# Patient Record
Sex: Female | Born: 1975 | Race: White | Hispanic: No | Marital: Married | Smoking: Current every day smoker
Health system: Southern US, Community
[De-identification: ages and names within clinical notes are randomized; demographics above are authoritative.]

## PROBLEM LIST (undated history)

## (undated) DIAGNOSIS — K219 Gastro-esophageal reflux disease without esophagitis: Secondary | ICD-10-CM

## (undated) DIAGNOSIS — F329 Major depressive disorder, single episode, unspecified: Secondary | ICD-10-CM

## (undated) DIAGNOSIS — K649 Unspecified hemorrhoids: Secondary | ICD-10-CM

## (undated) DIAGNOSIS — R0602 Shortness of breath: Secondary | ICD-10-CM

## (undated) DIAGNOSIS — J449 Chronic obstructive pulmonary disease, unspecified: Secondary | ICD-10-CM

## (undated) DIAGNOSIS — R079 Chest pain, unspecified: Secondary | ICD-10-CM

## (undated) DIAGNOSIS — F32A Depression, unspecified: Secondary | ICD-10-CM

## (undated) HISTORY — DX: Unspecified hemorrhoids: K64.9

## (undated) HISTORY — PX: NOVASURE ABLATION: SHX5394

## (undated) HISTORY — PX: TONSILLECTOMY: SUR1361

## (undated) HISTORY — PX: TUBAL LIGATION: SHX77

## (undated) HISTORY — PX: OTHER SURGICAL HISTORY: SHX169

---

## 2008-09-21 HISTORY — PX: OTHER SURGICAL HISTORY: SHX169

## 2012-04-11 ENCOUNTER — Encounter (HOSPITAL_COMMUNITY): Payer: Self-pay | Admitting: *Deleted

## 2012-04-11 ENCOUNTER — Emergency Department (HOSPITAL_COMMUNITY)
Admission: EM | Admit: 2012-04-11 | Discharge: 2012-04-11 | Disposition: A | Discharge status: Home / Self Care | Payer: Self-pay | Attending: Emergency Medicine | Admitting: Emergency Medicine

## 2012-04-11 ENCOUNTER — Emergency Department (HOSPITAL_COMMUNITY): Payer: Self-pay

## 2012-04-11 DIAGNOSIS — M79609 Pain in unspecified limb: Secondary | ICD-10-CM | POA: Insufficient documentation

## 2012-04-11 DIAGNOSIS — F17200 Nicotine dependence, unspecified, uncomplicated: Secondary | ICD-10-CM | POA: Insufficient documentation

## 2012-04-11 DIAGNOSIS — M79673 Pain in unspecified foot: Secondary | ICD-10-CM

## 2012-04-11 MED ORDER — IBUPROFEN 400 MG PO TABS: 400.0000 mg | ORAL_TABLET | Freq: Three times a day (TID) | ORAL | Status: AC

## 2012-04-11 NOTE — ED Notes (Signed)
Pt c/o foot pain--mid upper sole x 1 day; hurts to walk on it

## 2012-04-11 NOTE — ED Notes (Signed)
Pt stated her left foot is hurting, and hurts to walk on it. Pt dropped the dresser drawer on her left foot.

## 2012-04-11 NOTE — ED Provider Notes (Signed)
History     CSN: [id number]  Arrival date & time 04/11/12  1950   First MD Initiated Contact with Patient 04/11/12 2122      Chief Complaint  Patient presents with  . Foot Pain    (Consider location/radiation/quality/duration/timing/severity/associated sxs/prior treatment) HPI The patient presents with pain about her distal left foot.  She denies any history of trauma, falls, tripping.  She states the pain is focally about the foot with no radiation.  No attempts at relief thus far with medication.  Pain is worse with motion or weight-bearing.  No other complaints. History reviewed. No pertinent past medical history.  History reviewed. No pertinent past surgical history.  No family history on file.  History  Substance Use Topics  . Smoking status: Current Everyday Smoker -- 1.5 packs/day  . Smokeless tobacco: Not on file  . Alcohol Use: No    OB History    Grav Para Term Preterm Abortions TAB SAB Ect Mult Living                  Review of Systems  All other systems reviewed and are negative.    Allergies  Penicillins and Trazodone and nefazodone  Home Medications   Current Outpatient Rx  Name Route Sig Dispense Refill  . IBUPROFEN 400 MG PO TABS Oral Take 1 tablet (400 mg total) by mouth 3 (three) times daily. 12 tablet 0    BP 108/79  Temp 98.3 F (36.8 C) (Oral)  Resp 24  Ht 5\' 2"  (1.575 m)  Wt 106 lb (48.081 kg)  BMI 19.39 kg/m2  SpO2 94%  LMP 04/11/2012  Physical Exam  Nursing note and vitals reviewed. Constitutional: She appears well-developed and well-nourished. No distress.  HENT:  Head: Normocephalic and atraumatic.  Eyes: Conjunctivae are normal.  Cardiovascular: Intact distal pulses.   Pulmonary/Chest: No stridor.  Musculoskeletal:       Arms:      Feet:  Skin: She is not diaphoretic.    ED Course  Procedures (including critical care time)  Labs Reviewed - No data to display Dg Foot Complete Left  04/11/2012  *RADIOLOGY  REPORT*  Clinical Data: Left foot pain.  LEFT FOOT - COMPLETE 3+ VIEW  Comparison: None  Findings: The joint spaces are maintained.  No acute fracture.  Pes cavus is noted.  IMPRESSION: No acute fracture.  Original Report Authenticated By: P. Loralie Champagne, M.D.     1. Foot pain       MDM  This generally well female presents with atraumatic pain in her left foot.  On exam she is in no distress.  There are no deficiencies, no deformities.  Given her history of no trauma, there suspicion for an inflammatory process.  The patient was discharged with anti-inflammatory medication, ice packs, orthopedics followup.    Gerhard Munch, MD 04/11/12 2157

## 2012-04-11 NOTE — ED Notes (Signed)
Pt verbalizes understanding 

## 2012-10-30 ENCOUNTER — Emergency Department (HOSPITAL_COMMUNITY)
Admission: EM | Admit: 2012-10-30 | Discharge: 2012-10-30 | Disposition: A | Discharge status: Home / Self Care | Payer: Medicaid Other | Attending: Emergency Medicine | Admitting: Emergency Medicine

## 2012-10-30 ENCOUNTER — Emergency Department (HOSPITAL_COMMUNITY): Payer: Medicaid Other

## 2012-10-30 ENCOUNTER — Encounter (HOSPITAL_COMMUNITY): Payer: Self-pay | Admitting: Emergency Medicine

## 2012-10-30 DIAGNOSIS — K6289 Other specified diseases of anus and rectum: Secondary | ICD-10-CM | POA: Insufficient documentation

## 2012-10-30 DIAGNOSIS — Z3202 Encounter for pregnancy test, result negative: Secondary | ICD-10-CM | POA: Insufficient documentation

## 2012-10-30 DIAGNOSIS — K625 Hemorrhage of anus and rectum: Secondary | ICD-10-CM | POA: Insufficient documentation

## 2012-10-30 DIAGNOSIS — F17200 Nicotine dependence, unspecified, uncomplicated: Secondary | ICD-10-CM | POA: Insufficient documentation

## 2012-10-30 DIAGNOSIS — K649 Unspecified hemorrhoids: Secondary | ICD-10-CM | POA: Insufficient documentation

## 2012-10-30 LAB — PROTIME-INR
INR: 1 (ref 0.00–1.49)
Prothrombin Time: 13.1 s (ref 11.6–15.2)

## 2012-10-30 LAB — BASIC METABOLIC PANEL WITH GFR
BUN: 6 mg/dL (ref 6–23)
CO2: 25 meq/L (ref 19–32)
Calcium: 9.1 mg/dL (ref 8.4–10.5)
Chloride: 104 meq/L (ref 96–112)
Creatinine, Ser: 0.75 mg/dL (ref 0.50–1.10)
GFR calc Af Amer: >90 mL/min
GFR calc non Af Amer: >90 mL/min
Glucose, Bld: 97 mg/dL (ref 70–99)
Potassium: 3.9 meq/L (ref 3.5–5.1)
Sodium: 138 meq/L (ref 135–145)

## 2012-10-30 LAB — CBC WITH DIFFERENTIAL/PLATELET
Eosinophils Absolute: 0 10*3/uL (ref 0.0–0.7)
Eosinophils Relative: 1 % (ref 0–5)
HCT: 39.1 % (ref 36.0–46.0)
Lymphocytes Relative: 38 % (ref 12–46)
Lymphs Abs: 2.9 10*3/uL (ref 0.7–4.0)
MCH: 32.8 pg (ref 26.0–34.0)
MCV: 92.9 fL (ref 78.0–100.0)
Monocytes Absolute: 0.7 10*3/uL (ref 0.1–1.0)
RBC: 4.21 MIL/uL (ref 3.87–5.11)
RDW: 12.7 % (ref 11.5–15.5)
WBC: 7.7 10*3/uL (ref 4.0–10.5)

## 2012-10-30 MED ORDER — IOHEXOL 300 MG/ML  SOLN
50.0000 mL | Freq: Once | INTRAMUSCULAR | Status: AC | PRN
  Administered 2012-10-30: 50 mL via ORAL

## 2012-10-30 MED ORDER — OXYCODONE-ACETAMINOPHEN 5-325 MG PO TABS
2.0000 | ORAL_TABLET | Freq: Once | ORAL | Status: AC
  Administered 2012-10-30: 2 via ORAL
  Filled 2012-10-30: qty 2

## 2012-10-30 MED ORDER — DOCUSATE SODIUM 100 MG PO CAPS: 100.0000 mg | ORAL_CAPSULE | Freq: Two times a day (BID) | ORAL | Status: DC

## 2012-10-30 MED ORDER — IOHEXOL 300 MG/ML  SOLN
80.0000 mL | Freq: Once | INTRAMUSCULAR | Status: AC | PRN
  Administered 2012-10-30: 80 mL via INTRAVENOUS

## 2012-10-30 MED ORDER — HYDROCORTISONE 2.5 % RE CREA: TOPICAL_CREAM | RECTAL | Status: DC

## 2012-10-30 MED ORDER — OXYCODONE-ACETAMINOPHEN 5-325 MG PO TABS: 2.0000 | ORAL_TABLET | ORAL | Status: DC | PRN

## 2012-10-30 NOTE — ED Notes (Addendum)
Pt c/o pain d/t hemorrhoids. Pt reports she has hx of them and gets them often. Pt reports these started on Friday and now the pain is so bad she can barely stand. Pt reports she has had to have them cut off multiple times but thinks they keep coming. Pt reports she saw a lot of bright blood after using the bathroom a couple days ago but hasnt notice any today. Pt in nad. Skin warm and dry, resp e/u.

## 2012-10-30 NOTE — ED Notes (Signed)
Pt sts painful hemorrhoids x 3 weeks with some rectal bleeding

## 2012-10-30 NOTE — ED Notes (Signed)
Pt discharged to home with family. NAD.  

## 2012-10-30 NOTE — ED Notes (Signed)
Pt finished contrast, CT called.

## 2012-10-30 NOTE — ED Provider Notes (Signed)
History     CSN: [id number]  Arrival date & time 10/30/12  1219   First MD Initiated Contact with Patient 10/30/12 1339      Chief Complaint  Patient presents with  . Hemorrhoids  . Rectal Bleeding    (Consider location/radiation/quality/duration/timing/severity/associated sxs/prior treatment) HPI Comments: Patient presents with rectal pain for the past day.  She states she has a history of internal hemorroids that occasionally cause her pain. She is painful defecation as well as red blood on toilet bowl filled the toilet water red. She denies abdominal pain nausea, vomiting or fever. She's had previous hemorrhoidectomies done not by a Careers adviser. She denies any chest pain, abdominal pain or back pain.  The history is provided by the patient.    History reviewed. No pertinent past medical history.  History reviewed. No pertinent past surgical history.  History reviewed. No pertinent family history.  History  Substance Use Topics  . Smoking status: Current Every Day Smoker -- 1.50 packs/day  . Smokeless tobacco: Not on file  . Alcohol Use: No    OB History   Grav Para Term Preterm Abortions TAB SAB Ect Mult Living                  Review of Systems  Constitutional: Negative for fever, activity change and appetite change.  HENT: Negative for congestion and rhinorrhea.   Respiratory: Negative for cough, chest tightness and shortness of breath.   Cardiovascular: Negative for chest pain.  Gastrointestinal: Positive for blood in stool, hematochezia and anal bleeding. Negative for nausea, vomiting and abdominal pain.  Genitourinary: Negative for dysuria, vaginal bleeding and vaginal discharge.  Musculoskeletal: Negative for back pain.  A complete 10 system review of systems was obtained and all systems are negative except as noted in the HPI and PMH.    Allergies  Penicillins and Trazodone and nefazodone  Home Medications   Current Outpatient Rx  Name  Route  Sig   Dispense  Refill  . acetaminophen (TYLENOL) 500 MG tablet   Oral   Take 500 mg by mouth every 6 (six) hours as needed for pain. For pain         . ibuprofen (ADVIL,MOTRIN) 200 MG tablet   Oral   Take 400 mg by mouth every 6 (six) hours as needed for pain. For pain         . docusate sodium (COLACE) 100 MG capsule   Oral   Take 1 capsule (100 mg total) by mouth every 12 (twelve) hours.   60 capsule   0   . hydrocortisone (ANUSOL-HC) 2.5 % rectal cream      Apply rectally 2 times daily   30 g   0   . oxyCODONE-acetaminophen (PERCOCET/ROXICET) 5-325 MG per tablet   Oral   Take 2 tablets by mouth every 4 (four) hours as needed for pain.   15 tablet   0     BP 97/52  Pulse 63  Temp(Src) 98.2 F (36.8 C) (Oral)  Resp 16  SpO2 99%  LMP 10/16/2012  Physical Exam  Constitutional: She is oriented to person, place, and time. She appears well-developed and well-nourished. No distress.  HENT:  Head: Normocephalic and atraumatic.  Mouth/Throat: Oropharynx is clear and moist. No oropharyngeal exudate.  Eyes: EOM are normal. Pupils are equal, round, and reactive to light.  Neck: Normal range of motion. Neck supple.  Cardiovascular: Normal rate and normal heart sounds.   No murmur heard. Pulmonary/Chest: Effort normal  and breath sounds normal. No respiratory distress.  Abdominal: There is no tenderness. There is no rebound and no guarding.  Genitourinary: Guaiac positive stool.  Multiple small external hemorroids, no thrombosis.  Painful digital exam.  Musculoskeletal: Normal range of motion. She exhibits no edema and no tenderness.  Neurological: She is alert and oriented to person, place, and time. No cranial nerve deficit. She exhibits normal muscle tone. Coordination normal.  Skin: Skin is warm.    ED Course  Procedures (including critical care time)  Labs Reviewed  OCCULT BLOOD, POC DEVICE - Abnormal; Notable for the following:    Fecal Occult Bld POSITIVE (*)     All other components within normal limits  CBC WITH DIFFERENTIAL  PROTIME-INR  BASIC METABOLIC PANEL  POCT PREGNANCY, URINE   Ct Pelvis W Contrast  10/30/2012  *RADIOLOGY REPORT*  Clinical Data:  Rectal bleeding.  Hemorrhoidal pain.  Query regional infection.  CT PELVIS WITH CONTRAST  Technique:  Multidetector CT imaging of the pelvis was performed using the standard protocol following the bolus administration of intravenous contrast.  Contrast: 80mL OMNIPAQUE IOHEXOL 300 MG/ML  SOLN  Comparison:  None  Findings:  No abnormal rim enhancing structure in the perirectal region to favor perirectal abscess.  Equivocal stranding in the upper perirectal space may be secondary to mild localized inflammation.  No abnormal stranding in the issue rectal fossa on either side.  Posterior wall thickening in the uterus noted with low density potentially from fibroid or adenomyosis.  Urinary bladder unremarkable.  Ovaries unremarkable. No pathologic pelvic adenopathy is identified.  Orally administered contrast extends through to the cecum, and the appendix appears normal.  IMPRESSION:  1.  No specific findings of perirectal abscess, although there is some mild stranding in the upper perirectal space which could reflect low-level inflammation. 2.  Posterior wall hypodensity and thickening in the uterus, potentially from adenomyosis or an unusual fibroid.   Original Report Authenticated By: Gaylyn Rong, M.D.      1. Hemorrhoid       MDM  Rectal pain with history of hemorrhoids. Vital stable, no distress, abdomen soft and nontender.  Hemoglobin stable.  No thrombosed hemorrhoid on exam. Given degree of discomfort, bloody stools and subjective fevers, CT obtained to r/o possible perirectal abscess.  Anticipate discharge home with anusol, stool softners, sitz baths and gen surgery followup.  CT pending at tie of sign out to Dr. Anitra Lauth.      Glynn Octave, MD 10/30/12 [phone]

## 2012-11-02 ENCOUNTER — Encounter (INDEPENDENT_AMBULATORY_CARE_PROVIDER_SITE_OTHER): Payer: Self-pay | Admitting: Surgery

## 2012-11-02 ENCOUNTER — Ambulatory Visit (INDEPENDENT_AMBULATORY_CARE_PROVIDER_SITE_OTHER): Payer: Medicaid Other | Admitting: Surgery

## 2012-11-02 VITALS — BP 122/76 | HR 84 | Temp 98.4°F | Resp 14 | Ht 62.0 in | Wt 109.0 lb

## 2012-11-02 DIAGNOSIS — K5900 Constipation, unspecified: Secondary | ICD-10-CM

## 2012-11-02 DIAGNOSIS — Z720 Tobacco use: Secondary | ICD-10-CM | POA: Insufficient documentation

## 2012-11-02 DIAGNOSIS — K648 Other hemorrhoids: Secondary | ICD-10-CM | POA: Insufficient documentation

## 2012-11-02 DIAGNOSIS — K5909 Other constipation: Secondary | ICD-10-CM

## 2012-11-02 DIAGNOSIS — F17200 Nicotine dependence, unspecified, uncomplicated: Secondary | ICD-10-CM

## 2012-11-02 MED ORDER — IBUPROFEN 200 MG PO TABS: 400.0000 mg | ORAL_TABLET | Freq: Three times a day (TID) | ORAL | Status: DC

## 2012-11-02 MED ORDER — HYDROCORTISONE ACE-PRAMOXINE 2.5-1 % RE CREA: TOPICAL_CREAM | Freq: Four times a day (QID) | RECTAL | Status: DC

## 2012-11-02 NOTE — Patient Instructions (Addendum)
See the Handout(s) we gave you.  Consider surgery Using the [facility] to ligate and pexy the hemorrhoids back up to the rectum.  Please call our office at [phone] if you wish to schedule surgery or if you have further questions / concerns.   HEMORRHOIDS  The rectum is the last foot of your colon, and it naturally stretches to hold stool.  Hemorrhoidal piles are natural clusters of blood vessels that help the rectum and anal canal stretch to hold stool and allow bowel movements to eliminate feces.   Hemorrhoids are abnormally swollen blood vessels in the rectum.  Too much pressure in the rectum causes hemorrhoids by forcing blood to stretch and bulge the walls of the veins, sometimes even rupturing them.  Hemorrhoids can become like varicose veins you might see on a person's legs.  Most people will develop a flare of hemorrhoids in their lifetime.  When bulging hemorrhoidal veins are irritated, they can swell, burn, itch, cause pain, and bleed.  Most flares will calm down gradually own within a few weeks.  However, once hemorrhoids are created, they are difficult to get rid of completely and tend to flare more easily than the first flare.   Fortunately, good habits and simple medical treatment usually control hemorrhoids well, and surgery is needed only in severe cases. Types of Hemorrhoids:  Internal hemorrhoids usually don't initially hurt or itch; they are deep inside the rectum and usually have no sensation. If they begin to push out (prolapse), pain and burning can occur.  However, internal hemorrhoids can bleed.  Anal bleeding should not be ignored since bleeding could come from a dangerous source like colorectal cancer, so persistent rectal bleeding should be investigated by a doctor, sometimes with a colonoscopy.  External hemorrhoids cause most of the symptoms - pain, burning, and itching. Nonirritated hemorrhoids can look like small skin tags coming out of the anus.   Thrombosed hemorrhoids can  form when a hemorrhoid blood vessel bursts and causes the hemorrhoid to suddenly swell.  A purple blood clot can form in it and become an excruciatingly painful lump at the anus. Because of these unpleasant symptoms, immediate incision and drainage by a surgeon at an office visit can provide much relief of the pain.    PREVENTION Avoiding the most frequent causes listed below will prevent most cases of hemorrhoids: Constipation Hard stools Diarrhea  Constant sitting  Straining with bowel movements Sitting on the toilet for a long time  Severe coughing  episodes Pregnancy / Childbirth  Heavy Lifting  Sometimes avoiding the above triggers is difficult:  How can you avoid sitting all day if you have a seated job? Also, we try to avoid coughing and diarrhea, but sometimes it's beyond your control.  Still, there are some practical hints to help: Keep the anal and genital area clean.  Moistened tissues such as flushable wet wipes are less irritating than toilet paper.  Using irrigating showers or bottle irrigation washing gently cleans this sensitive area.   Avoid dry toilet paper when cleaning after bowel movements.  Marland Kitchen Keep the anal and genital area dry.  Lightly pat the rectal area dry.  Avoid rubbing.  Talcum or baby powders can help GET YOUR STOOLS SOFT.   This is the most important way to prevent irritated hemorrhoids.  Hard stools are like sandpaper to the anorectal canal and will cause more problems.  The goal: ONE SOFT BOWEL MOVEMENT A DAY!  BMs from every other day to 3 times  a day is a tolerable range Treat coughing, diarrhea and constipation early since irritated hemorrhoids may soon follow.  If your main job activity is seated, always stand or walk during your breaks. Make it a point to stand and walk at least 5 minutes every hour and try to shift frequently in your chair to avoid direct rectal pressure.  Always exhale as you strain or lift. Don't hold your breath.  Do not delay or try to  prevent a bowel movement when the urge is present. Exercise regularly (walking or jogging 60 minutes a day) to stimulate the bowels to move. No reading or other activity while on the toilet. If bowel movements take longer than 5 minutes, you are too constipated. AVOID CONSTIPATION Drink plenty of liquids (1 1/2 to 2 quarts of water and other fluids a day unless fluid restricted for another medical condition). Liquids that contain caffeine (coffee a, tea, soft drinks) can be dehydrating and should be avoided until constipation is controlled. Consider minimizing milk, as dairy products may be constipating. Eat plenty of fiber (30g a day ideal, more if needed).  Fiber is the undigested part of plant food that passes into the colon, acting as "natures broom" to encourage bowel motility and movement.  Fiber can absorb and hold large amounts of water. This results in a larger, bulkier stool, which is soft and easier to pass.  Eating foods high in fiber - 12 servings - such as  Vegetables: Root (potatoes, carrots, turnips), Leafy green (lettuce, salad greens, celery, spinach), High residue (cabbage, broccoli, etc.) Fruit: Fresh, Dried (prunes, apricots, cherries), Stewed (applesauce)  Whole grain breads, pasta, whole wheat Bran cereals, muffins, etc. Consider adding supplemental bulking fiber which retains large volumes of water: Psyllium ground seeds --available as Metamucil, Konsyl, Effersyllium, Per Diem Fiber, or the less expensive generic forms.  Citrucel  (methylcellulose wood fiber) . FiberCon (Polycarbophil) Polyethylene Glycol - and "artificial" fiber commonly called Miralax or Glycolax.  It is helpful for people with gassy or bloated feelings with regular fiber Flax Seed - a less gassy natural fiber  Laxatives can be useful for a short period if constipation is severe Osmotics (Milk of Magnesia, Fleets Phospho-Soda, Magnesium Citrate)  Stimulants (Senokot,   Castor Oil,  Dulcolax, Ex-Lax)     Laxatives are not a good long-term solution as it can stress the bowels and cause too much mineral loss and dehydration.   Avoid taking laxatives for more than 7 days in a row.  AVOID DIARRHEA Switch to liquids and simpler foods for a few days to avoid stressing your intestines further. Avoid dairy products (especially milk & ice cream) for a short time.  The intestines often can lose the ability to digest lactose when stressed. Avoid foods that cause gassiness or bloating.  Typical foods include beans and other legumes, cabbage, broccoli, and dairy foods.  Every person has some sensitivity to other foods, so listen to your body and avoid those foods that trigger problems for you. Adding fiber (Citrucel, Metamucil, FiberCon, Flax seed, Miralax) gradually can help thicken stools by absorbing excess fluid and retrain the intestines to act more normally.  Slowly increase the dose over a few weeks.  Too much fiber too soon can backfire and cause cramping & bloating. Probiotics (such as active yogurt, Align, etc) may help repopulate the intestines and colon with normal bacteria and calm down a sensitive digestive tract.  Most studies show it to be of mild help, though, and such products can be  costly. Medicines: Bismuth subsalicylate (ex. Kayopectate, Pepto Bismol) every 30 minutes for up to 6 doses can help control diarrhea.  Avoid if pregnant. Loperamide (Immodium) can slow down diarrhea.  Start with two tablets (4mg  total) first and then try one tablet every 6 hours.  Avoid if you are having fevers or severe pain.  If you are not better or start feeling worse, stop all medicines and call your doctor for advice Call your doctor if you are getting worse or not better.  Sometimes further testing (cultures, endoscopy, X-ray studies, bloodwork, etc) may be needed to help diagnose and treat the cause of the diarrhea. TREATMENT OF HEMORRHOID FLARE If these preventive measures fail, you must take action right  away! Hemorrhoids are one condition that can be mild in the morning and become intolerable by nightfall. Most hemorrhoidal flares take several weeks to calm down.  These suggestions can help: Warm soaks.  This helps more than any topical medication.  Use up to 8 times a day.  Usually sitz baths or sitting in a warm bathtub helps.  Sitting on moist warm towels are helpful.  Switching to ice packs/cool compresses can be helpful Normalize your bowels.  Extremes of diarrhea or constipation will make hemorrhoids worse.  One soft bowel movement a day is the goal.  Fiber can help get your bowels regular Wet wipes instead of toilet paper Pain control with a NSAID such as ibuprofen (Advil) or naproxen (Aleve) or acetaminophen (Tylenol) around the clock.  Narcotics are constipating and should be minimized if possible Topical creams contain steroids (bydrocortisone) or local anesthetic (xylocaine) can help make pain and itching more tolerable.   EVALUATION If hemorrhoids are still causing problems, you could benefit by an evaluation by a surgeon.  The surgeon will obtain a history and examine you.  If hemorrhoids are diagnosed, some therapies can be offered in the office, usually with an anoscope into the less sensitive area of the rectum: -injection of hemorrhoids (sclerotherapy) can scar the blood vessels of the swollen/enlarged hemorrhoids to help shrink them down to a more normal size -rubber banding of the enlarged hemorrhoids to help shrink them down to a more normal size -drainage of the blood clot causing a thrombosed hemorrhoid,  to relieve the severe pain   While 90% of the time such problems from hemorrhoids can be managed without preceding to surgery, sometimes the hemorrhoids require a operation to control the problem (uncontrolled bleeding, prolapse, pain, etc.).   This involves being placed under general anesthesia where the surgeon can confirm the diagnosis and remove, suture, or staple the  hemorrhoid(s).  Your surgeon can help you treat the problem appropriately.    High-Fiber Diet Fiber is found in fruits, vegetables, and grains. A high-fiber diet encourages the addition of more whole grains, legumes, fruits, and vegetables in your diet. The recommended amount of fiber for adult males is 38 g per day. For adult females, it is 25 g per day. Pregnant and lactating women should get 28 g of fiber per day. If you have a digestive or bowel problem, ask your caregiver for advice before adding high-fiber foods to your diet. Eat a variety of high-fiber foods instead of only a select few type of foods.  PURPOSE  To increase stool bulk.  To make bowel movements more regular to prevent constipation.  To lower cholesterol.  To prevent overeating. WHEN IS THIS DIET USED?  It may be used if you have constipation and hemorrhoids.  It may  be used if you have uncomplicated diverticulosis (intestine condition) and irritable bowel syndrome.  It may be used if you need help with weight management.  It may be used if you want to add it to your diet as a protective measure against atherosclerosis, diabetes, and cancer. SOURCES OF FIBER  Whole-grain breads and cereals.  Fruits, such as apples, oranges, bananas, berries, prunes, and pears.  Vegetables, such as green peas, carrots, sweet potatoes, beets, broccoli, cabbage, spinach, and artichokes.  Legumes, such split peas, soy, lentils.  Almonds. FIBER CONTENT IN FOODS Starches and Grains / Dietary Fiber (g)  Cheerios, 1 cup / 3 g  Corn Flakes cereal, 1 cup / 0.7 g  Rice crispy treat cereal, 1 cup / 0.3 g  Instant oatmeal (cooked),  cup / 2 g  Frosted wheat cereal, 1 cup / 5.1 g  Brown, long-grain rice (cooked), 1 cup / 3.5 g  White, long-grain rice (cooked), 1 cup / 0.6 g  Enriched macaroni (cooked), 1 cup / 2.5 g Legumes / Dietary Fiber (g)  Baked beans (canned, plain, or vegetarian),  cup / 5.2 g  Kidney beans  (canned),  cup / 6.8 g  Pinto beans (cooked),  cup / 5.5 g Breads and Crackers / Dietary Fiber (g)  Plain or honey graham crackers, 2 squares / 0.7 g  Saltine crackers, 3 squares / 0.3 g  Plain, salted pretzels, 10 pieces / 1.8 g  Whole-wheat bread, 1 slice / 1.9 g  White bread, 1 slice / 0.7 g  Raisin bread, 1 slice / 1.2 g  Plain bagel, 3 oz / 2 g  Flour tortilla, 1 oz / 0.9 g  Corn tortilla, 1 small / 1.5 g  Hamburger or hotdog bun, 1 small / 0.9 g Fruits / Dietary Fiber (g)  Apple with skin, 1 medium / 4.4 g  Sweetened applesauce,  cup / 1.5 g  Banana,  medium / 1.5 g  Grapes, 10 grapes / 0.4 g  Orange, 1 small / 2.3 g  Raisin, 1.5 oz / 1.6 g  Melon, 1 cup / 1.4 g Vegetables / Dietary Fiber (g)  Green beans (canned),  cup / 1.3 g  Carrots (cooked),  cup / 2.3 g  Broccoli (cooked),  cup / 2.8 g  Peas (cooked),  cup / 4.4 g  Mashed potatoes,  cup / 1.6 g  Lettuce, 1 cup / 0.5 g  Corn (canned),  cup / 1.6 g  Tomato,  cup / 1.1 g Document Released: 09/07/2005 Document Revised: 03/08/2012 Document Reviewed: 12/10/2011 [facility] Patient Information 2013 [place], [place].  Smoking Cessation, Tips for Success YOU CAN QUIT SMOKING If you are ready to quit smoking, congratulations! You have chosen to help yourself be healthier. Cigarettes bring nicotine, tar, carbon monoxide, and other irritants into your body. Your lungs, heart, and blood vessels will be able to work better without these poisons. There are many different ways to quit smoking. Nicotine gum, nicotine patches, a nicotine inhaler, or nicotine nasal spray can help with physical craving. Hypnosis, support groups, and medicines help break the habit of smoking. Here are some tips to help you quit for good.  Throw away all cigarettes.  Clean and remove all ashtrays from your home, work, and car.  On a card, write down your reasons for quitting. Carry the card with you and read it when you  get the urge to smoke.  Cleanse your body of nicotine. Drink enough water and fluids to keep your urine  clear or pale yellow. Do this after quitting to flush the nicotine from your body.  Learn to predict your moods. Do not let a bad situation be your excuse to have a cigarette. Some situations in your life might tempt you into wanting a cigarette.  Never have "just one" cigarette. It leads to wanting another and another. Remind yourself of your decision to quit.  Change habits associated with smoking. If you smoked while driving or when feeling stressed, try other activities to replace smoking. Stand up when drinking your coffee. Brush your teeth after eating. Sit in a different chair when you read the paper. Avoid alcohol while trying to quit, and try to drink fewer caffeinated beverages. Alcohol and caffeine may urge you to smoke.  Avoid foods and drinks that can trigger a desire to smoke, such as sugary or spicy foods and alcohol.  Ask people who smoke not to smoke around you.  Have something planned to do right after eating or having a cup of coffee. Take a walk or exercise to perk you up. This will help to keep you from overeating.  Try a relaxation exercise to calm you down and decrease your stress. Remember, you may be tense and nervous for the first 2 weeks after you quit, but this will pass.  Find new activities to keep your hands busy. Play with a pen, coin, or rubber band. Doodle or draw things on paper.  Brush your teeth right after eating. This will help cut down on the craving for the taste of tobacco after meals. You can try mouthwash, too.  Use oral substitutes, such as lemon drops, carrots, a cinnamon stick, or chewing gum, in place of cigarettes. Keep them handy so they are available when you have the urge to smoke.  When you have the urge to smoke, try deep breathing.  Designate your home as a nonsmoking area.  If you are a heavy smoker, ask your caregiver about a  prescription for nicotine chewing gum. It can ease your withdrawal from nicotine.  Reward yourself. Set aside the cigarette money you save and buy yourself something nice.  Look for support from others. Join a support group or smoking cessation program. Ask someone at home or at work to help you with your plan to quit smoking.  Always ask yourself, "Do I need this cigarette or is this just a reflex?" Tell yourself, "Today, I choose not to smoke," or "I do not want to smoke." You are reminding yourself of your decision to quit, even if you do smoke a cigarette. HOW WILL I FEEL WHEN I QUIT SMOKING?  The benefits of not smoking start within days of quitting.  You may have symptoms of withdrawal because your body is used to nicotine (the addictive substance in cigarettes). You may crave cigarettes, be irritable, feel very hungry, cough often, get headaches, or have difficulty concentrating.  The withdrawal symptoms are only temporary. They are strongest when you first quit but will go away within 10 to 14 days.  When withdrawal symptoms occur, stay in control. Think about your reasons for quitting. Remind yourself that these are signs that your body is healing and getting used to being without cigarettes.  Remember that withdrawal symptoms are easier to treat than the major diseases that smoking can cause.  Even after the withdrawal is over, expect periodic urges to smoke. However, these cravings are generally short-lived and will go away whether you smoke or not. Do not smoke!  If  you relapse and smoke again, do not lose hope. Most smokers quit 3 times before they are successful.  If you relapse, do not give up! Plan ahead and think about what you will do the next time you get the urge to smoke. LIFE AS A NONSMOKER: MAKE IT FOR A MONTH, MAKE IT FOR LIFE Day 1: Hang this page where you will see it every day. Day 2: Get rid of all ashtrays, matches, and lighters. Day 3: Drink water. Breathe  deeply between sips. Day 4: Avoid places with smoke-filled air, such as bars, clubs, or the smoking section of restaurants. Day 5: Keep track of how much money you save by not smoking. Day 6: Avoid boredom. Keep a good book with you or go to the movies. Day 7: Reward yourself! One week without smoking! Day 8: Make a dental appointment to get your teeth cleaned. Day 9: Decide how you will turn down a cigarette before it is offered to you. Day 10: Review your reasons for quitting. Day 11: Distract yourself. Stay active to keep your mind off smoking and to relieve tension. Take a walk, exercise, read a book, do a crossword puzzle, or try a new hobby. Day 12: Exercise. Get off the bus before your stop or use stairs instead of escalators. Day 13: Call on friends for support and encouragement. Day 14: Reward yourself! Two weeks without smoking! Day 15: Practice deep breathing exercises. Day 16: Bet a friend that you can stay a nonsmoker. Day 17: Ask to sit in nonsmoking sections of restaurants. Day 18: Hang up "No Smoking" signs. Day 19: Think of yourself as a nonsmoker. Day 20: Each morning, tell yourself you will not smoke. Day 21: Reward yourself! Three weeks without smoking! Day 22: Think of smoking in negative ways. Remember how it stains your teeth, gives you bad breath, and leaves you short of breath. Day 23: Eat a nutritious breakfast. Day 24:Do not relive your days as a smoker. Day 25: Hold a pencil in your hand when talking on the telephone. Day 26: Tell all your friends you do not smoke. Day 27: Think about how much better food tastes. Day 28: Remember, one cigarette is one too many. Day 29: Take up a hobby that will keep your hands busy. Day 30: Congratulations! One month without smoking! Give yourself a big reward. Your caregiver can direct you to community resources or hospitals for support, which may include:  Group support.  Education.  Hypnosis.  Subliminal  therapy. Document Released: 06/05/2004 Document Revised: 11/30/2011 Document Reviewed: 06/24/2009 [facility] Patient Information 2013 [place], [place].

## 2012-11-02 NOTE — Progress Notes (Addendum)
Subjective:     Patient ID: Vickie Doyle, female   DOB: 04/24/1976, 36 y.o.   MRN: [id number]  HPI  [name]  10/21/1975 [id number]  Patient has no care team.  This patient is a 36 y.o.female who presents today for surgical evaluation at the request of Dr. Rancour, [place] [facility].   Reason for visit: Anal pain.  ? Worsening recurrent hemorrhoid disease.  Pleasant smoking female.  Has struggled with hemorrhoids for several years.  Usually manage down and [place] region with the [facility].  Has had what sounds like thrombosed hemorrhoids lanced at least two times in the past few years.  Had a colonoscopy which showed one polyp.  Due to from for followup colonoscopy next year.    Struggles with constipation.  Used to be rather severe.  Uses a probiotic Align to have a bowel movement every 2-3 days.  Claims she is "like a bird, so I can't make much to have a BM".  Cannot walk about 15 minutes before she has to stop.  Former heavy smoker.  Down to less than a pack now.  Has a lot of discomfort with defecation.  Sometimes sharp.  Often has bleeding.  Feels frustrated.  Pain usually worse around the time of menses.  Feels like surgery needs to be done.  No personal nor family history of GI/colon cancer, inflammatory bowel disease, irritable bowel syndrome, allergy such as Celiac Sprue, dietary/dairy problems, colitis, ulcers nor gastritis.  No recent sick contacts/gastroenteritis.  No travel outside the country.  No changes in diet.    There is no problem list on file for this patient.   Past Medical History  Diagnosis Date  . Hemorrhoids     History reviewed. No pertinent past surgical history.  History   Social History  . Marital Status: Married    Spouse Name: N/A    Number of Children: N/A  . Years of Education: N/A   Occupational History  . Not on file.   Social History Main Topics  . Smoking status: Current Every Day Smoker -- 1.00 packs/day  .  Smokeless tobacco: Not on file  . Alcohol Use: No  . Drug Use: Yes    Special: Marijuana  . Sexually Active: Not on file   Other Topics Concern  . Not on file   Social History Narrative  . No narrative on file    Family History  Problem Relation Age of Onset  . Heart disease Sister     Current Outpatient Prescriptions  Medication Sig Dispense Refill  . acetaminophen (TYLENOL) 500 MG tablet Take 500 mg by mouth every 6 (six) hours as needed for pain. For pain      . docusate sodium (COLACE) 100 MG capsule Take 1 capsule (100 mg total) by mouth every 12 (twelve) hours.  60 capsule  0  . hydrocortisone (ANUSOL-HC) 2.5 % rectal cream Apply rectally 2 times daily  30 g  0  . ibuprofen (ADVIL,MOTRIN) 200 MG tablet Take 400 mg by mouth every 6 (six) hours as needed for pain. For pain      . oxyCODONE-acetaminophen (PERCOCET/ROXICET) 5-325 MG per tablet Take 2 tablets by mouth every 4 (four) hours as needed for pain.  15 tablet  0   No current facility-administered medications for this visit.     Allergies  Allergen Reactions  . Penicillins Hives  . Trazodone And Nefazodone Nausea And Vomiting    BP 122/76  Pulse 84  Temp(Src) 98.4   F (36.9 C) (Temporal)  Resp 14  Ht 5' 2" (1.575 m)  Wt 109 lb (49.442 kg)  BMI 19.93 kg/m2  LMP 10/16/2012  Ct Pelvis W Contrast  10/30/2012  *RADIOLOGY REPORT*  Clinical Data:  Rectal bleeding.  Hemorrhoidal pain.  Query regional infection.  CT PELVIS WITH CONTRAST  Technique:  Multidetector CT imaging of the pelvis was performed using the standard protocol following the bolus administration of intravenous contrast.  Contrast: 80mL OMNIPAQUE IOHEXOL 300 MG/ML  SOLN  Comparison:  None  Findings:  No abnormal rim enhancing structure in the perirectal region to favor perirectal abscess.  Equivocal stranding in the upper perirectal space may be secondary to mild localized inflammation.  No abnormal stranding in the issue rectal fossa on either side.   Posterior wall thickening in the uterus noted with low density potentially from fibroid or adenomyosis.  Urinary bladder unremarkable.  Ovaries unremarkable. No pathologic pelvic adenopathy is identified.  Orally administered contrast extends through to the cecum, and the appendix appears normal.  IMPRESSION:  1.  No specific findings of perirectal abscess, although there is some mild stranding in the upper perirectal space which could reflect low-level inflammation. 2.  Posterior wall hypodensity and thickening in the uterus, potentially from adenomyosis or an unusual fibroid.   Original Report Authenticated By: Walter Liebkemann, M.D.      Review of Systems  Constitutional: Negative for fever, chills, diaphoresis, appetite change and fatigue.  HENT: Negative for ear pain, sore throat, trouble swallowing, neck pain and ear discharge.   Eyes: Negative for photophobia, discharge and visual disturbance.  Respiratory: Negative for cough, choking, chest tightness and shortness of breath.   Cardiovascular: Negative for chest pain and palpitations.  Gastrointestinal: Negative for nausea, vomiting, abdominal pain, diarrhea, constipation, anal bleeding and rectal pain.  Genitourinary: Negative for dysuria, frequency and difficulty urinating.  Musculoskeletal: Negative for myalgias and gait problem.  Skin: Negative for color change, pallor and rash.  Neurological: Negative for dizziness, speech difficulty, weakness and numbness.  Hematological: Negative for adenopathy.  Psychiatric/Behavioral: Negative for confusion and agitation. The patient is not nervous/anxious.        Objective:   Physical Exam  Constitutional: She is oriented to person, place, and time. She appears well-developed and well-nourished. No distress.  HENT:  Head: Normocephalic. Head is without raccoon's eyes, without Battle's sign and without abrasion.  Mouth/Throat: Oropharynx is clear and moist. No oropharyngeal exudate.  Face  with premature aging consistent with chronic smoker  Eyes: Conjunctivae and EOM are normal. Pupils are equal, round, and reactive to light. No scleral icterus.  Neck: Normal range of motion. Neck supple. No tracheal deviation present.  Cardiovascular: Normal rate, regular rhythm and intact distal pulses.   Pulmonary/Chest: Effort normal and breath sounds normal. No respiratory distress. She exhibits no tenderness.  Abdominal: Soft. She exhibits no distension and no mass. There is no tenderness. Hernia confirmed negative in the right inguinal area and confirmed negative in the left inguinal area.  Genitourinary: No vaginal discharge found.  Exam done with assistance of female Medical Assistant in the room.  Perianal skin clean with good hygiene.  No pruritis.  No pilonidal disease.  No fissure.  No abscess/fistula.    Tolerates digital rectal exam fairly.  Anoscopy not attempted with sensitivity.  Normal sphincter tone.  No rectal masses.  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  Chronically prolapsed partially.  Easily prolapses with mild Valsalva.  No thrombosis.   Musculoskeletal: Normal range of   motion. She exhibits no tenderness.  Lymphadenopathy:    She has no cervical adenopathy.       Right: No inguinal adenopathy present.       Left: No inguinal adenopathy present.  Neurological: She is alert and oriented to person, place, and time. No cranial nerve deficit. She exhibits normal muscle tone. Coordination normal.  Skin: Skin is warm and dry. No rash noted. She is not diaphoretic. No erythema.  Numerous piercings and tattoos on the face, abdomen, and hands..  No evidence of infections.  Psychiatric: She has a normal mood and affect. Her behavior is normal. Judgment and thought content normal.       Assessment:     Internal hemorrhoids in the setting of chronic constipation with chronic prolapse, persistent pain, recurrent bleeding.     Plan:     At this point, I think  her hemorrhoidal piles are too far gone to try and manage In the office.  She is too sensitive to tolerate banding in the office.  She has had drainage of thrombosed hemorrhoids in the past.  She has a history of chronic constipation but it is a better place with the help of Align.  I strongly recommend she add MiraLAX or another fiber supplement to increase softness and frequency of bowels to daily.  I think she is a good candidate for THD to help manage all piles.  She is very interested:  The anatomy & physiology of the anorectal region was discussed.  The pathophysiology of hemorrhoids and differential diagnosis was discussed.  Natural history risks without surgery was discussed.   I stressed the importance of a bowel regimen to have daily soft bowel movements to minimize progression of disease.  Interventions such as sclerotherapy & banding were discussed.  The patient's symptoms are not adequately controlled by medicines and other non-operative treatments.  I feel the risks & problems of no surgery outweigh the operative risks; therefore, I recommended surgery to treat the hemorrhoids by ligation, pexy, and possible resection.  Risks such as bleeding, infection, need for further treatment, heart attack, death, and other risks were discussed.   I noted a good likelihood this will help address the problem.  Goals of post-operative recovery were discussed as well.  Possibility that this will not correct all symptoms was explained.  Post-operative pain, bleeding, constipation, and other problems after surgery were discussed.  We will work to minimize complications.   Educational handouts further explaining the pathology, treatment options, and bowel regimen were given as well.  Questions were answered.  The patient expresses understanding & wishes to proceed with surgery.  We talked to the patient about the dangers of smoking.  We stressed that tobacco use dramatically increases the risk of peri-operative  complications such as infection, tissue necrosis leaving to problems with incision/wound and organ healing, heart attack, stroke, DVT, pulmonary embolism, and death.  We noted there are programs in our community to help stop smoking.  Followup colonoscopy in 2014 Given history of adenomatous polyps.  Trying get records from [facility]/[place]          

## 2012-11-21 ENCOUNTER — Encounter (HOSPITAL_COMMUNITY): Payer: Self-pay | Admitting: Pharmacy Technician

## 2012-11-22 ENCOUNTER — Telehealth (INDEPENDENT_AMBULATORY_CARE_PROVIDER_SITE_OTHER): Payer: Self-pay | Admitting: General Surgery

## 2012-11-22 NOTE — Telephone Encounter (Signed)
Pt of Dr. Michaell Cowing called to request pain meds.  She is scheduled for hem surgery on 12/02/12.  Pt admits to having just made a car-trip to [facility] with family and now has exacerbated the pain.  Pt takes stool softeners and fiber, is having regular BMs, is soaking in warm tubs and using the Analpram as directed.  She states the ibuprofen is not helping with the pain at this point and is asking if Dr. Michaell Cowing will call in something stronger for her.  Paged and updated Dr. Dutch Quint Hydrocodone 5/325 mg,  # 30, 1-2 po Q4-6H prn pain, no refill; also increased tub soaks and double up on fiber.  Pt understands and will comply.  Called in meds to [facility]:  [phone].

## 2012-11-25 ENCOUNTER — Inpatient Hospital Stay (HOSPITAL_COMMUNITY): Admission: RE | Admit: 2012-11-25 | Payer: Medicaid Other | Source: Ambulatory Visit

## 2012-11-28 DIAGNOSIS — R079 Chest pain, unspecified: Secondary | ICD-10-CM

## 2012-11-28 HISTORY — DX: Chest pain, unspecified: R07.9

## 2012-11-29 ENCOUNTER — Encounter (HOSPITAL_COMMUNITY): Payer: Self-pay

## 2012-11-29 ENCOUNTER — Encounter (HOSPITAL_COMMUNITY)
Admission: RE | Admit: 2012-11-29 | Discharge: 2012-11-29 | Disposition: A | Discharge status: Home / Self Care | Payer: Medicaid Other | Source: Ambulatory Visit | Attending: Surgery | Admitting: Surgery

## 2012-11-29 ENCOUNTER — Other Ambulatory Visit (HOSPITAL_COMMUNITY): Payer: Self-pay | Admitting: Surgery

## 2012-11-29 ENCOUNTER — Ambulatory Visit (HOSPITAL_COMMUNITY)
Admission: RE | Admit: 2012-11-29 | Discharge: 2012-11-29 | Disposition: A | Discharge status: Home / Self Care | Payer: Medicaid Other | Source: Ambulatory Visit | Attending: Surgery | Admitting: Surgery

## 2012-11-29 VITALS — HR 87 | Temp 98.2°F | Resp 18 | Ht 63.0 in | Wt 106.8 lb

## 2012-11-29 DIAGNOSIS — K219 Gastro-esophageal reflux disease without esophagitis: Secondary | ICD-10-CM | POA: Insufficient documentation

## 2012-11-29 DIAGNOSIS — Z01812 Encounter for preprocedural laboratory examination: Secondary | ICD-10-CM | POA: Insufficient documentation

## 2012-11-29 DIAGNOSIS — K648 Other hemorrhoids: Secondary | ICD-10-CM

## 2012-11-29 DIAGNOSIS — Z01810 Encounter for preprocedural cardiovascular examination: Secondary | ICD-10-CM | POA: Insufficient documentation

## 2012-11-29 DIAGNOSIS — J4489 Other specified chronic obstructive pulmonary disease: Secondary | ICD-10-CM | POA: Insufficient documentation

## 2012-11-29 HISTORY — DX: Chest pain, unspecified: R07.9

## 2012-11-29 HISTORY — DX: Major depressive disorder, single episode, unspecified: F32.9

## 2012-11-29 HISTORY — DX: Chronic obstructive pulmonary disease, unspecified: J44.9

## 2012-11-29 HISTORY — DX: Depression, unspecified: F32.A

## 2012-11-29 HISTORY — DX: Gastro-esophageal reflux disease without esophagitis: K21.9

## 2012-11-29 HISTORY — DX: Shortness of breath: R06.02

## 2012-11-29 LAB — BASIC METABOLIC PANEL
BUN: 7 mg/dL (ref 6–23)
Calcium: 9.2 mg/dL (ref 8.4–10.5)
Creatinine, Ser: 0.66 mg/dL (ref 0.50–1.10)
GFR calc Af Amer: >90 mL/min (ref >90)
GFR calc non Af Amer: >90 mL/min (ref >90)
Glucose, Bld: 103 mg/dL — ABNORMAL HIGH (ref 70–99)
Potassium: 3.8 mEq/L (ref 3.5–5.1)

## 2012-11-29 LAB — CBC
Hemoglobin: 13.2 g/dL (ref 12.0–15.0)
MCH: 31.3 pg (ref 26.0–34.0)
MCHC: 33.8 g/dL (ref 30.0–36.0)
Platelets: 348 10*3/uL (ref 150–400)

## 2012-11-29 LAB — SURGICAL PCR SCREEN: MRSA, PCR: NEGATIVE

## 2012-11-29 LAB — HCG, SERUM, QUALITATIVE: Preg, Serum: NEGATIVE

## 2012-11-29 NOTE — Progress Notes (Addendum)
Have orders except for or consent. Please enter or consent orders in epic thanks.

## 2012-11-29 NOTE — Patient Instructions (Addendum)
20 DORALEE KOCAK  11/29/2012   Your procedure is scheduled on: 12-02-2012  Report to [facility] at 0515 AM.  Call this number if you have problems the morning of surgery [phone]   Remember:   Do not eat food :After Midnight. After tonight per dr gross.              Cleat liquids all day Wednesday 11-30-2012 and Thursday 12-01-2012 per dr gross, no liquids after midnight Thursday night,.   Take these medicines the morning of surgery with A SIP OF WATER: no meds to take                                SEE  [facility] FOR SURG[facility] SHEET   Do not wear jewelry, make-up or nail polish.  Do not wear lotions, powders, or perfumes. You may wear deodorant.   Men may shave face and neck.  Do not bring valuables to the hospital.  Contacts, dentures or bridgework may not be worn into surgery.  Leave suitcase in the car. After surgery it may be brought to your room.  For patients admitted to the hospital, checkout time is 11:00 AM the day of discharge.   Patients discharged the day of surgery will not be allowed to drive home.  Name and phone number of your driver:lunette bridgmon mother cell [phone]  Special Instructions: N/A   Please read over the following fact sheets that you were given: MRSA Information.  Call Cain Sieve RN pre op nurse if needed [phone]    FAILURE TO FOLLOW THESE INSTRUCTIONS MAY RESULT IN THE CANCELLATION OF YOUR SURGERY. PATIENT SIGNATURE___________________________________________

## 2012-11-30 ENCOUNTER — Other Ambulatory Visit (INDEPENDENT_AMBULATORY_CARE_PROVIDER_SITE_OTHER): Payer: Self-pay | Admitting: Surgery

## 2012-11-30 ENCOUNTER — Telehealth (INDEPENDENT_AMBULATORY_CARE_PROVIDER_SITE_OTHER): Payer: Self-pay

## 2012-11-30 NOTE — Telephone Encounter (Signed)
Called pt to let her know the cxr results were normal.

## 2012-12-01 ENCOUNTER — Encounter (HOSPITAL_COMMUNITY): Payer: Self-pay | Admitting: *Deleted

## 2012-12-01 ENCOUNTER — Encounter (HOSPITAL_COMMUNITY): Payer: Self-pay

## 2012-12-01 NOTE — Progress Notes (Signed)
Called pt and pt stated speaking to dr gross nurse 11-30-2012 concerning bowel prep and milk of magnesia and clear liquid instructions for surgery

## 2012-12-02 ENCOUNTER — Encounter (HOSPITAL_COMMUNITY)
Admission: RE | Disposition: A | Discharge status: Home / Self Care | Payer: Self-pay | Source: Ambulatory Visit | Attending: Surgery

## 2012-12-02 ENCOUNTER — Ambulatory Visit (HOSPITAL_COMMUNITY): Payer: Medicaid Other | Admitting: Anesthesiology

## 2012-12-02 ENCOUNTER — Ambulatory Visit (HOSPITAL_COMMUNITY)
Admission: RE | Admit: 2012-12-02 | Discharge: 2012-12-02 | Disposition: A | Discharge status: Home / Self Care | Payer: Medicaid Other | Source: Ambulatory Visit | Attending: Surgery | Admitting: Surgery

## 2012-12-02 ENCOUNTER — Encounter (HOSPITAL_COMMUNITY): Payer: Self-pay | Admitting: *Deleted

## 2012-12-02 ENCOUNTER — Encounter (HOSPITAL_COMMUNITY): Payer: Self-pay | Admitting: Anesthesiology

## 2012-12-02 DIAGNOSIS — K649 Unspecified hemorrhoids: Secondary | ICD-10-CM

## 2012-12-02 DIAGNOSIS — K5909 Other constipation: Secondary | ICD-10-CM

## 2012-12-02 DIAGNOSIS — Z860100 Personal history of colon polyps, unspecified: Secondary | ICD-10-CM | POA: Insufficient documentation

## 2012-12-02 DIAGNOSIS — K5900 Constipation, unspecified: Secondary | ICD-10-CM | POA: Insufficient documentation

## 2012-12-02 DIAGNOSIS — K648 Other hemorrhoids: Secondary | ICD-10-CM

## 2012-12-02 HISTORY — PX: TRANSANAL HEMORRHOIDAL DEARTERIALIZATION: SHX6136

## 2012-12-02 SURGERY — TRANSANAL HEMORRHOIDAL DEARTERIALIZATION
Anesthesia: General | Site: Rectum | Wound class: Contaminated

## 2012-12-02 MED ORDER — METRONIDAZOLE IN NACL 5-0.79 MG/ML-% IV SOLN
500.0000 mg | INTRAVENOUS | Status: AC
  Administered 2012-12-02: .5 g via INTRAVENOUS

## 2012-12-02 MED ORDER — LACTATED RINGERS IV SOLN
INTRAVENOUS | Status: DC | PRN
  Administered 2012-12-02: 07:00:00 via INTRAVENOUS

## 2012-12-02 MED ORDER — SODIUM CHLORIDE 0.9 % IJ SOLN
INTRAMUSCULAR | Status: DC | PRN
  Administered 2012-12-02: 10 mL

## 2012-12-02 MED ORDER — ONDANSETRON HCL 4 MG/2ML IJ SOLN
INTRAMUSCULAR | Status: DC | PRN
  Administered 2012-12-02: 4 mg via INTRAVENOUS

## 2012-12-02 MED ORDER — ACETAMINOPHEN 10 MG/ML IV SOLN
INTRAVENOUS | Status: DC | PRN
  Administered 2012-12-02: 1000 mg via INTRAVENOUS

## 2012-12-02 MED ORDER — SODIUM CHLORIDE 0.9 % IV SOLN: 250.0000 mL | INTRAVENOUS | Status: DC | PRN

## 2012-12-02 MED ORDER — DEXAMETHASONE SODIUM PHOSPHATE 10 MG/ML IJ SOLN
INTRAMUSCULAR | Status: DC | PRN
  Administered 2012-12-02: 10 mg via INTRAVENOUS

## 2012-12-02 MED ORDER — SODIUM CHLORIDE 0.9 % IJ SOLN: 3.0000 mL | INTRAMUSCULAR | Status: DC | PRN

## 2012-12-02 MED ORDER — MIDAZOLAM HCL 5 MG/5ML IJ SOLN
INTRAMUSCULAR | Status: DC | PRN
  Administered 2012-12-02: 2 mg via INTRAVENOUS

## 2012-12-02 MED ORDER — PROMETHAZINE HCL 25 MG/ML IJ SOLN: 6.2500 mg | INTRAMUSCULAR | Status: DC | PRN

## 2012-12-02 MED ORDER — ACETAMINOPHEN 325 MG PO TABS: 650.0000 mg | ORAL_TABLET | ORAL | Status: DC | PRN

## 2012-12-02 MED ORDER — KETOROLAC TROMETHAMINE 30 MG/ML IJ SOLN
15.0000 mg | Freq: Once | INTRAMUSCULAR | Status: AC | PRN
  Administered 2012-12-02: 30 mg via INTRAVENOUS

## 2012-12-02 MED ORDER — LACTATED RINGERS IV SOLN: INTRAVENOUS | Status: DC

## 2012-12-02 MED ORDER — CIPROFLOXACIN IN D5W 400 MG/200ML IV SOLN
INTRAVENOUS | Status: AC
  Filled 2012-12-02: qty 200

## 2012-12-02 MED ORDER — PROPOFOL 10 MG/ML IV BOLUS
INTRAVENOUS | Status: DC | PRN
  Administered 2012-12-02: 150 mg via INTRAVENOUS

## 2012-12-02 MED ORDER — 0.9 % SODIUM CHLORIDE (POUR BTL) OPTIME
TOPICAL | Status: DC | PRN
  Administered 2012-12-02: 1000 mL

## 2012-12-02 MED ORDER — FENTANYL CITRATE 0.05 MG/ML IJ SOLN
INTRAMUSCULAR | Status: AC
  Filled 2012-12-02: qty 2

## 2012-12-02 MED ORDER — ACETAMINOPHEN 650 MG RE SUPP
650.0000 mg | RECTAL | Status: DC | PRN
  Filled 2012-12-02: qty 1

## 2012-12-02 MED ORDER — GLYCOPYRROLATE 0.2 MG/ML IJ SOLN
INTRAMUSCULAR | Status: DC | PRN
  Administered 2012-12-02: .6 mg via INTRAVENOUS

## 2012-12-02 MED ORDER — CIPROFLOXACIN IN D5W 400 MG/200ML IV SOLN
400.0000 mg | INTRAVENOUS | Status: AC
  Administered 2012-12-02: 400 mg via INTRAVENOUS

## 2012-12-02 MED ORDER — OXYCODONE HCL 5 MG PO TABS: 5.0000 mg | ORAL_TABLET | ORAL | Status: DC | PRN

## 2012-12-02 MED ORDER — FENTANYL CITRATE 0.05 MG/ML IJ SOLN
25.0000 ug | INTRAMUSCULAR | Status: DC | PRN
  Administered 2012-12-02: 25 ug via INTRAVENOUS
  Administered 2012-12-02: 50 ug via INTRAVENOUS
  Administered 2012-12-02: 25 ug via INTRAVENOUS
  Administered 2012-12-02: 50 ug via INTRAVENOUS

## 2012-12-02 MED ORDER — ACETAMINOPHEN 10 MG/ML IV SOLN
INTRAVENOUS | Status: AC
  Filled 2012-12-02: qty 100

## 2012-12-02 MED ORDER — ROCURONIUM BROMIDE 100 MG/10ML IV SOLN
INTRAVENOUS | Status: DC | PRN
  Administered 2012-12-02: 30 mg via INTRAVENOUS

## 2012-12-02 MED ORDER — FENTANYL CITRATE 0.05 MG/ML IJ SOLN
INTRAMUSCULAR | Status: DC | PRN
  Administered 2012-12-02 (×3): 50 ug via INTRAVENOUS
  Administered 2012-12-02: 100 ug via INTRAVENOUS

## 2012-12-02 MED ORDER — BUPIVACAINE LIPOSOME 1.3 % IJ SUSP
20.0000 mL | INTRAMUSCULAR | Status: AC
  Administered 2012-12-02: 20 mL
  Filled 2012-12-02: qty 20

## 2012-12-02 MED ORDER — FENTANYL CITRATE 0.05 MG/ML IJ SOLN: 25.0000 ug | INTRAMUSCULAR | Status: DC | PRN

## 2012-12-02 MED ORDER — METRONIDAZOLE IN NACL 5-0.79 MG/ML-% IV SOLN
INTRAVENOUS | Status: AC
  Filled 2012-12-02: qty 100

## 2012-12-02 MED ORDER — NEOSTIGMINE METHYLSULFATE 1 MG/ML IJ SOLN
INTRAMUSCULAR | Status: DC | PRN
  Administered 2012-12-02: 3 mg via INTRAVENOUS

## 2012-12-02 MED ORDER — LIDOCAINE HCL (CARDIAC) 20 MG/ML IV SOLN
INTRAVENOUS | Status: DC | PRN
  Administered 2012-12-02: 50 mg via INTRAVENOUS

## 2012-12-02 MED ORDER — KETOROLAC TROMETHAMINE 30 MG/ML IJ SOLN
INTRAMUSCULAR | Status: AC
  Filled 2012-12-02: qty 1

## 2012-12-02 MED ORDER — ONDANSETRON HCL 4 MG/2ML IJ SOLN: 4.0000 mg | Freq: Four times a day (QID) | INTRAMUSCULAR | Status: DC | PRN

## 2012-12-02 MED ORDER — SODIUM CHLORIDE 0.9 % IJ SOLN: 3.0000 mL | Freq: Two times a day (BID) | INTRAMUSCULAR | Status: DC

## 2012-12-02 MED ORDER — OXYCODONE HCL 5 MG PO TABS
5.0000 mg | ORAL_TABLET | ORAL | Status: DC | PRN
  Administered 2012-12-02: 5 mg via ORAL
  Filled 2012-12-02: qty 1

## 2012-12-02 SURGICAL SUPPLY — 36 items
BLADE EXTENDED COATED 6.5IN (ELECTRODE) IMPLANT
BLADE HEX COATED 2.75 (ELECTRODE) IMPLANT
BRIEF STRETCH FOR OB PAD LRG (UNDERPADS AND DIAPERS) IMPLANT
CANISTER SUCTION 2500CC (MISCELLANEOUS) ×2 IMPLANT
CLOTH BEACON ORANGE TIMEOUT ST (SAFETY) ×2 IMPLANT
DECANTER SPIKE VIAL GLASS SM (MISCELLANEOUS) ×2 IMPLANT
DRAPE LAPAROTOMY T 102X78X121 (DRAPES) ×2 IMPLANT
DRSG PAD ABDOMINAL 8X10 ST (GAUZE/BANDAGES/DRESSINGS) IMPLANT
ELECT REM PT RETURN 9FT ADLT (ELECTROSURGICAL) ×2
ELECTRODE REM PT RTRN 9FT ADLT (ELECTROSURGICAL) ×1 IMPLANT
GAUZE SPONGE 4X4 16PLY XRAY LF (GAUZE/BANDAGES/DRESSINGS) ×2 IMPLANT
GLOVE ECLIPSE 8.0 STRL XLNG CF (GLOVE) ×2 IMPLANT
GLOVE INDICATOR 8.0 STRL GRN (GLOVE) ×4 IMPLANT
GOWN STRL NON-REIN LRG LVL3 (GOWN DISPOSABLE) ×2 IMPLANT
GOWN STRL REIN XL XLG (GOWN DISPOSABLE) ×4 IMPLANT
HEMOSTAT SURGICEL 4X8 (HEMOSTASIS) ×2 IMPLANT
KIT SLIDE ONE PROLAPS HEMORR (KITS) ×2 IMPLANT
LUBRICANT JELLY K Y 4OZ (MISCELLANEOUS) ×2 IMPLANT
NDL SAFETY ECLIPSE 18X1.5 (NEEDLE) ×1 IMPLANT
NEEDLE HYPO 18GX1.5 SHARP (NEEDLE) ×1
NEEDLE HYPO 22GX1.5 SAFETY (NEEDLE) ×2 IMPLANT
NS IRRIG 1000ML POUR BTL (IV SOLUTION) ×2 IMPLANT
PACK BASIC VI WITH GOWN DISP (CUSTOM PROCEDURE TRAY) IMPLANT
PACK LITHOTOMY IV (CUSTOM PROCEDURE TRAY) IMPLANT
PENCIL BUTTON HOLSTER BLD 10FT (ELECTRODE) IMPLANT
SPONGE GAUZE 4X4 12PLY (GAUZE/BANDAGES/DRESSINGS) ×2 IMPLANT
SPONGE SURGIFOAM ABS GEL 100 (HEMOSTASIS) ×2 IMPLANT
SUT CHROMIC 2 0 SH (SUTURE) IMPLANT
SUT CHROMIC 3 0 SH 27 (SUTURE) IMPLANT
SUT PROLENE 2 0 BLUE (SUTURE) ×2 IMPLANT
SUT VIC AB 2-0 UR6 27 (SUTURE) IMPLANT
SUT VIC AB 3-0 SH 18 (SUTURE) IMPLANT
SUT VIC AB 3-0 SH 27 (SUTURE)
SUT VIC AB 3-0 SH 27XBRD (SUTURE) IMPLANT
SYR 20CC LL (SYRINGE) ×2 IMPLANT
TOWEL OR 17X26 10 PK STRL BLUE (TOWEL DISPOSABLE) ×2 IMPLANT

## 2012-12-02 NOTE — H&P (View-Only) (Signed)
Subjective:     Patient ID: Vickie Doyle, female   DOB: 02/17/1976, 37 y.o.   MRN: [id number]  HPI  Vickie Doyle  [birth year] [id number]  Patient has no care team.  This patient is a 37 y.o.female who presents today for surgical evaluation at the request of Dr. Manus Gunning, [facility] ED.   Reason for visit: Anal pain.  ? Worsening recurrent hemorrhoid disease.  Pleasant smoking female.  Has struggled with hemorrhoids for several years.  Usually manage down and [place] with the [facility].  Has had what sounds like thrombosed hemorrhoids lanced at least two times in the past few years.  Had a colonoscopy which showed one polyp.  Due to from for followup colonoscopy next year.    Struggles with constipation.  Used to be rather severe.  Uses a probiotic Align to have a bowel movement every 2-3 days.  Claims she is "like a bird, so I can't make much to have a BM".  Cannot walk about 15 minutes before she has to stop.  Former heavy smoker.  Down to less than a pack now.  Has a lot of discomfort with defecation.  Sometimes sharp.  Often has bleeding.  Feels frustrated.  Pain usually worse around the time of menses.  Feels like surgery needs to be done.  No personal nor family history of GI/colon cancer, inflammatory bowel disease, irritable bowel syndrome, allergy such as Celiac Sprue, dietary/dairy problems, colitis, ulcers nor gastritis.  No recent sick contacts/gastroenteritis.  No travel outside the country.  No changes in diet.    There is no problem list on file for this patient.   Past Medical History  Diagnosis Date  . Hemorrhoids     History reviewed. No pertinent past surgical history.  History   Social History  . Marital Status: Married    Spouse Name: N/A    Number of Children: N/A  . Years of Education: N/A   Occupational History  . Not on file.   Social History Main Topics  . Smoking status: Current Every Day Smoker -- 1.00 packs/day  .  Smokeless tobacco: Not on file  . Alcohol Use: No  . Drug Use: Yes    Special: Marijuana  . Sexually Active: Not on file   Other Topics Concern  . Not on file   Social History Narrative  . No narrative on file    Family History  Problem Relation Age of Onset  . Heart disease Sister     Current Outpatient Prescriptions  Medication Sig Dispense Refill  . acetaminophen (TYLENOL) 500 MG tablet Take 500 mg by mouth every 6 (six) hours as needed for pain. For pain      . docusate sodium (COLACE) 100 MG capsule Take 1 capsule (100 mg total) by mouth every 12 (twelve) hours.  60 capsule  0  . hydrocortisone (ANUSOL-HC) 2.5 % rectal cream Apply rectally 2 times daily  30 g  0  . ibuprofen (ADVIL,MOTRIN) 200 MG tablet Take 400 mg by mouth every 6 (six) hours as needed for pain. For pain      . oxyCODONE-acetaminophen (PERCOCET/ROXICET) 5-325 MG per tablet Take 2 tablets by mouth every 4 (four) hours as needed for pain.  15 tablet  0   No current facility-administered medications for this visit.     Allergies  Allergen Reactions  . Penicillins Hives  . Trazodone And Nefazodone Nausea And Vomiting    BP 122/76  Pulse 84  Temp(Src) 98.4  F (36.9 C) (Temporal)  Resp 14  Ht 5\' 2"  (1.575 m)  Wt 109 lb (49.442 kg)  BMI 19.93 kg/m2  LMP 10/16/2012  Ct Pelvis W Contrast  10/30/2012  *RADIOLOGY REPORT*  Clinical Data:  Rectal bleeding.  Hemorrhoidal pain.  Query regional infection.  CT PELVIS WITH CONTRAST  Technique:  Multidetector CT imaging of the pelvis was performed using the standard protocol following the bolus administration of intravenous contrast.  Contrast: 80mL OMNIPAQUE IOHEXOL 300 MG/ML  SOLN  Comparison:  None  Findings:  No abnormal rim enhancing structure in the perirectal region to favor perirectal abscess.  Equivocal stranding in the upper perirectal space may be secondary to mild localized inflammation.  No abnormal stranding in the issue rectal fossa on either side.   Posterior wall thickening in the uterus noted with low density potentially from fibroid or adenomyosis.  Urinary bladder unremarkable.  Ovaries unremarkable. No pathologic pelvic adenopathy is identified.  Orally administered contrast extends through to the cecum, and the appendix appears normal.  IMPRESSION:  1.  No specific findings of perirectal abscess, although there is some mild stranding in the upper perirectal space which could reflect low-level inflammation. 2.  Posterior wall hypodensity and thickening in the uterus, potentially from adenomyosis or an unusual fibroid.   Original Report Authenticated By: Gaylyn Rong, M.D.      Review of Systems  Constitutional: Negative for fever, chills, diaphoresis, appetite change and fatigue.  HENT: Negative for ear pain, sore throat, trouble swallowing, neck pain and ear discharge.   Eyes: Negative for photophobia, discharge and visual disturbance.  Respiratory: Negative for cough, choking, chest tightness and shortness of breath.   Cardiovascular: Negative for chest pain and palpitations.  Gastrointestinal: Negative for nausea, vomiting, abdominal pain, diarrhea, constipation, anal bleeding and rectal pain.  Genitourinary: Negative for dysuria, frequency and difficulty urinating.  Musculoskeletal: Negative for myalgias and gait problem.  Skin: Negative for color change, pallor and rash.  Neurological: Negative for dizziness, speech difficulty, weakness and numbness.  Hematological: Negative for adenopathy.  Psychiatric/Behavioral: Negative for confusion and agitation. The patient is not nervous/anxious.        Objective:   Physical Exam  Constitutional: She is oriented to person, place, and time. She appears well-developed and well-nourished. No distress.  HENT:  Head: Normocephalic. Head is without raccoon's eyes, without Battle's sign and without abrasion.  Mouth/Throat: Oropharynx is clear and moist. No oropharyngeal exudate.  Face  with premature aging consistent with chronic smoker  Eyes: Conjunctivae and EOM are normal. Pupils are equal, round, and reactive to light. No scleral icterus.  Neck: Normal range of motion. Neck supple. No tracheal deviation present.  Cardiovascular: Normal rate, regular rhythm and intact distal pulses.   Pulmonary/Chest: Effort normal and breath sounds normal. No respiratory distress. She exhibits no tenderness.  Abdominal: Soft. She exhibits no distension and no mass. There is no tenderness. Hernia confirmed negative in the right inguinal area and confirmed negative in the left inguinal area.  Genitourinary: No vaginal discharge found.  Exam done with assistance of female Medical Assistant in the room.  Perianal skin clean with good hygiene.  No pruritis.  No pilonidal disease.  No fissure.  No abscess/fistula.    Tolerates digital rectal exam fairly.  Anoscopy not attempted with sensitivity.  Normal sphincter tone.  No rectal masses.  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  Chronically prolapsed partially.  Easily prolapses with mild Valsalva.  No thrombosis.   Musculoskeletal: Normal range of  motion. She exhibits no tenderness.  Lymphadenopathy:    She has no cervical adenopathy.       Right: No inguinal adenopathy present.       Left: No inguinal adenopathy present.  Neurological: She is alert and oriented to person, place, and time. No cranial nerve deficit. She exhibits normal muscle tone. Coordination normal.  Skin: Skin is warm and dry. No rash noted. She is not diaphoretic. No erythema.  Numerous piercings and tattoos on the face, abdomen, and hands..  No evidence of infections.  Psychiatric: She has a normal mood and affect. Her behavior is normal. Judgment and thought content normal.       Assessment:     Internal hemorrhoids in the setting of chronic constipation with chronic prolapse, persistent pain, recurrent bleeding.     Plan:     At this point, I think  her hemorrhoidal piles are too far gone to try and manage In the office.  She is too sensitive to tolerate banding in the office.  She has had drainage of thrombosed hemorrhoids in the past.  She has a history of chronic constipation but it is a better place with the help of Align.  I strongly recommend she add MiraLAX or another fiber supplement to increase softness and frequency of bowels to daily.  I think she is a good candidate for THD to help manage all piles.  She is very interested:  The anatomy & physiology of the anorectal region was discussed.  The pathophysiology of hemorrhoids and differential diagnosis was discussed.  Natural history risks without surgery was discussed.   I stressed the importance of a bowel regimen to have daily soft bowel movements to minimize progression of disease.  Interventions such as sclerotherapy & banding were discussed.  The patient's symptoms are not adequately controlled by medicines and other non-operative treatments.  I feel the risks & problems of no surgery outweigh the operative risks; therefore, I recommended surgery to treat the hemorrhoids by ligation, pexy, and possible resection.  Risks such as bleeding, infection, need for further treatment, heart attack, death, and other risks were discussed.   I noted a good likelihood this will help address the problem.  Goals of post-operative recovery were discussed as well.  Possibility that this will not correct all symptoms was explained.  Post-operative pain, bleeding, constipation, and other problems after surgery were discussed.  We will work to minimize complications.   Educational handouts further explaining the pathology, treatment options, and bowel regimen were given as well.  Questions were answered.  The patient expresses understanding & wishes to proceed with surgery.  We talked to the patient about the dangers of smoking.  We stressed that tobacco use dramatically increases the risk of peri-operative  complications such as infection, tissue necrosis leaving to problems with incision/wound and organ healing, heart attack, stroke, DVT, pulmonary embolism, and death.  We noted there are programs in our community to help stop smoking.  Followup colonoscopy in 2014 Given history of adenomatous polyps.  Trying get records from [facility]

## 2012-12-02 NOTE — Op Note (Signed)
12/02/2012  8:32 AM  PATIENT:  Vickie Doyle  37 y.o. female  Patient has no care team.  PRE-OPERATIVE DIAGNOSIS:  prolasped bleeding hemorroids   POST-OPERATIVE DIAGNOSIS:  prolasped bleeding hemorroids   PROCEDURE:  Procedure(s): TRANSANAL HEMORRHOIDAL DEARTERIALIZATION ligation pexy using THD system  SURGEON:  Surgeon(s): Ardeth Sportsman, MD  ASSISTANT: Rn   ANESTHESIA:   local and general  EBL:     Delay start of Pharmacological VTE agent (>24hrs) due to surgical blood loss or risk of bleeding:  no  DRAINS: none   SPECIMEN:  No Specimen  DISPOSITION OF SPECIMEN:  N/A  COUNTS:  YES  PLAN OF CARE: Discharge to home after PACU  PATIENT DISPOSITION:  PACU - hemodynamically stable.  INDICATION: Pt with prolapsing hemorrhoids & prior I&D.  The anatomy & physiology of the anorectal region was discussed.  The pathophysiology of hemorrhoids and differential diagnosis was discussed.  Natural history risks without surgery was discussed.   I stressed the importance of a bowel regimen to have daily soft bowel movements to minimize progression of disease.  Interventions such as sclerotherapy & banding were discussed.  The patient's symptoms are not adequately controlled by medicines and other non-operative treatments.  I feel the risks & problems of no surgery outweigh the operative risks; therefore, I recommended surgery to treat the hemorrhoids by ligation, pexy, and possible resection.  Risks such as bleeding, infection, need for further treatment, heart attack, death, and other risks were discussed.   I noted a good likelihood this will help address the problem.  Goals of post-operative recovery were discussed as well.  Possibility that this will not correct all symptoms was explained.  Post-operative pain, bleeding, constipation, and other problems after surgery were discussed.  We will work to minimize complications.   Educational handouts further explaining the pathology,  treatment options, and bowel regimen were given as well.  Questions were answered.  The patient expresses understanding & wishes to proceed with surgery.   OR FINDINGS: Circumferential internal hemorrhoids with prolapse.  Normal sphincter tone.  No fistula.  No fissure.  A bulky rectum but no true proccidentia/complete prolapse.  DESCRIPTION:   Informed consent was confirmed. Patient underwent general anesthesia without difficulty. Patient was placed into prone positioning.  The perianal region was prepped and draped in sterile fashion. Surgical tunnel confirmed or plan.  I did digital rectal examination and then transitioned over to anoscopy to get a sense of the anatomy.  I switched over to the [facility] fiberoptically lit Doppler anocope.   Using the Doppler on the tip of the THD anoscope, I identified the arterial hemorrhoidal vessels coming in in the classic hexagonal anatomical pattern (right posterior/lateral/anterior, left posterior /lateral/anterior).    I proceeded to ligate the hemorrhoidal arteries. I used a 2-0 Vicryl suture on a UR-6 needle in a figure-of-eight fashion over the signal around 6 cm proximal to the anal verge. I then ran that stitch longitudinally more distally to the white line of [place]. I then tied that stitch down to cause a hemorrhoidopexy. I did that for all 6 locations.    I redid Doppler anoscopy. I Identified a signal at the left anterior & lateral locations.  I isolated and ligated these with a figure-of-eight stitch. Signals went away.  At completion of this, all hemorrhoids were reduced into the rectum. There is no more prolapse. External anatomy looked normal.  I repeated anoscopy and examination.  Hemostasis was good. I placed a soft Gelfoam cylinder into  the rectum. Patient is being extubated go to recovery room.  I am about to discuss the patient's status to the family.

## 2012-12-02 NOTE — Anesthesia Postprocedure Evaluation (Signed)
  Anesthesia Post-op Note  Patient: Vickie Doyle  Procedure(s) Performed: Procedure(s) (LRB): TRANSANAL HEMORRHOIDAL DEARTERIALIZATION ligation pexy  (N/A)  Patient Location: PACU  Anesthesia Type: General  Level of Consciousness: awake and alert   Airway and Oxygen Therapy: Patient Spontanous Breathing  Post-op Pain: mild  Post-op Assessment: Post-op Vital signs reviewed, Patient's Cardiovascular Status Stable, Respiratory Function Stable, Patent Airway and No signs of Nausea or vomiting  Last Vitals:  Filed Vitals:   12/02/12 0845  BP: 116/73  Pulse: 79  Temp:   Resp: 15    Post-op Vital Signs: stable   Complications: No apparent anesthesia complications

## 2012-12-02 NOTE — Interval H&P Note (Signed)
History and Physical Interval Note:  12/02/2012 6:52 AM  Vickie Doyle  has presented today for surgery, with the diagnosis of prolasped bleeding hemorroids   The various methods of treatment have been discussed with the patient and family. After consideration of risks, benefits and other options for treatment, the patient has consented to  Procedure(s): TRANSANAL HEMORRHOIDAL DEARTERIALIZATION ligation pexy  (N/A) as a surgical intervention .  The patient's history has been reviewed, patient examined, no change in status, stable for surgery.  I have reviewed the patient's chart and labs.  Questions were answered to the patient's satisfaction.     GROSS,STEVEN C.

## 2012-12-02 NOTE — Transfer of Care (Signed)
Immediate Anesthesia Transfer of Care Note  Patient: Vickie Doyle  Procedure(s) Performed: Procedure(s) (LRB): TRANSANAL HEMORRHOIDAL DEARTERIALIZATION ligation pexy  (N/A)  Patient Location: PACU  Anesthesia Type: General  Level of Consciousness: sedated, patient cooperative and responds to stimulaton  Airway & Oxygen Therapy: Patient Spontanous Breathing and Patient connected to face mask oxgen  Post-op Assessment: Report given to PACU RN and Post -op Vital signs reviewed and stable  Post vital signs: Reviewed and stable  Complications: No apparent anesthesia complications

## 2012-12-02 NOTE — Anesthesia Preprocedure Evaluation (Signed)
Anesthesia Evaluation  Patient identified by MRN, date of birth, ID band Patient awake    Reviewed: Allergy & Precautions, H&P , NPO status , Patient's Chart, lab work & pertinent test results  Airway Mallampati: II TM Distance: >3 FB Neck ROM: Full    Dental no notable dental hx.    Pulmonary Current Smoker,  breath sounds clear to auscultation  Pulmonary exam normal       Cardiovascular negative cardio ROS  Rhythm:Regular Rate:Normal     Neuro/Psych negative neurological ROS  negative psych ROS   GI/Hepatic negative GI ROS, Neg liver ROS,   Endo/Other  negative endocrine ROS  Renal/GU negative Renal ROS  negative genitourinary   Musculoskeletal negative musculoskeletal ROS (+)   Abdominal   Peds negative pediatric ROS (+)  Hematology negative hematology ROS (+)   Anesthesia Other Findings   Reproductive/Obstetrics negative OB ROS                           Anesthesia Physical Anesthesia Plan  ASA: II  Anesthesia Plan: General   Post-op Pain Management:    Induction: Intravenous  Airway Management Planned: Oral ETT and LMA  Additional Equipment:   Intra-op Plan:   Post-operative Plan: Extubation in OR  Informed Consent: I have reviewed the patients History and Physical, chart, labs and discussed the procedure including the risks, benefits and alternatives for the proposed anesthesia with the patient or authorized representative who has indicated his/her understanding and acceptance.   Dental advisory given  Plan Discussed with: CRNA and Surgeon  Anesthesia Plan Comments:         Anesthesia Quick Evaluation

## 2012-12-03 ENCOUNTER — Telehealth (INDEPENDENT_AMBULATORY_CARE_PROVIDER_SITE_OTHER): Payer: Self-pay | Admitting: General Surgery

## 2012-12-03 MED ORDER — TAMSULOSIN HCL 0.4 MG PO CAPS: 0.4000 mg | ORAL_CAPSULE | Freq: Every day | ORAL | Status: DC

## 2012-12-03 NOTE — Telephone Encounter (Signed)
Pt called in c/o sensation urge to defecate and anal pain as well as difficulty urinating. Has voided but dribbling. Reinforced need for sitz baths, stool softner, high fiber diet, miralax, minimize narcotics. Will call flomax to see if help with urinary issue

## 2012-12-05 ENCOUNTER — Other Ambulatory Visit (INDEPENDENT_AMBULATORY_CARE_PROVIDER_SITE_OTHER): Payer: Self-pay | Admitting: Surgery

## 2012-12-05 ENCOUNTER — Telehealth (INDEPENDENT_AMBULATORY_CARE_PROVIDER_SITE_OTHER): Payer: Self-pay | Admitting: *Deleted

## 2012-12-05 ENCOUNTER — Telehealth (INDEPENDENT_AMBULATORY_CARE_PROVIDER_SITE_OTHER): Payer: Self-pay | Admitting: General Surgery

## 2012-12-05 ENCOUNTER — Encounter (HOSPITAL_COMMUNITY): Payer: Self-pay | Admitting: Surgery

## 2012-12-05 ENCOUNTER — Telehealth (INDEPENDENT_AMBULATORY_CARE_PROVIDER_SITE_OTHER): Payer: Self-pay

## 2012-12-05 DIAGNOSIS — K648 Other hemorrhoids: Secondary | ICD-10-CM

## 2012-12-05 DIAGNOSIS — K5909 Other constipation: Secondary | ICD-10-CM

## 2012-12-05 MED ORDER — HYDROCORTISONE ACE-PRAMOXINE 2.5-1 % RE CREA: TOPICAL_CREAM | Freq: Four times a day (QID) | RECTAL | Status: DC

## 2012-12-05 MED ORDER — OXYCODONE HCL 5 MG PO TABS: 5.0000 mg | ORAL_TABLET | Freq: Four times a day (QID) | ORAL | Status: DC | PRN

## 2012-12-05 NOTE — Telephone Encounter (Signed)
Patient called this morning to state that she is still having pain and pressure.  Patient spoke with Dr. Andrey Campanile and Dr. Ezzard Standing over the weekend and was given suggestions.  Patient states she has tried all the recommendation but is still having pain and pressure.  Message sent to Dr. Michaell Cowing for any further suggestions.

## 2012-12-05 NOTE — Telephone Encounter (Signed)
Try proctofoam 1% PR

## 2012-12-05 NOTE — Telephone Encounter (Signed)
Patient also asked for a refill of pain medication.  Protocol refill called into [facility] [phone]. Norco 5/325mg  1-2 tabs every 4-6 hours as needed for pain. #30 no refills.

## 2012-12-05 NOTE — Telephone Encounter (Signed)
Called pt to notify her to p/u written rx for Oxycodone 5mg  #50 per Dr Michaell Cowing at the front desk. I also reordered the analpram Rx to [facility] per Dr Michaell Cowing. The pt has a f/u appt on 12-14-12.

## 2012-12-05 NOTE — Telephone Encounter (Signed)
Received instructions from Gross MD.  This RN called patient and instructed patient per Gross MD: Naproxen 500mg  BID.  Can use Oxycodone 10-20mg  per dose as needed. Warm baths 6-8 times per day. Warm towels. Wet wipes. Analpram to help with pain.  The tails of the sutures can be trimmed but there is nothing she has to "cut".  Patient states understanding of all and agreeable.

## 2012-12-05 NOTE — Telephone Encounter (Signed)
Pt called to report this the cream that was called in is too expensive and would like another called in if possible.  Please advise.

## 2012-12-05 NOTE — Telephone Encounter (Signed)
Pt picked up new Oxycodone prescription and took it to [facility], who would not fill it secondary to recent fill of # 50 on 12/02/12.  Per VO Dr. Michaell Cowing, okay to fill this today, but no more narcotics until seen in the office next week.  Call the pharmacist to issue the okay and the pt to pick it up.

## 2012-12-05 NOTE — Telephone Encounter (Signed)
Called [facility] and cancelled the Norco prescription since patient probably needs to stay on Oxycodone at this time.  Will speak with Gross MD this afternoon regarding refilling current pain medication.

## 2012-12-06 ENCOUNTER — Other Ambulatory Visit (INDEPENDENT_AMBULATORY_CARE_PROVIDER_SITE_OTHER): Payer: Self-pay

## 2012-12-06 MED ORDER — PRAMOXINE HCL 1 % RE FOAM: RECTAL | Status: DC | PRN

## 2012-12-08 ENCOUNTER — Telehealth (INDEPENDENT_AMBULATORY_CARE_PROVIDER_SITE_OTHER): Payer: Self-pay

## 2012-12-08 NOTE — Telephone Encounter (Signed)
Called pt back after she spoke with triage this am b/c she is having some trouble with diarrhea after having THD surgery last week. The pt got a stomach bug from her children and had diarrhea 10 times yesterday. The pt stopped her pain medicine this week b/c she didn't want to run out of them till her f/u appt with Dr Michaell Cowing. The pt has been taking just ibuprofen not the Naproxen. I spoke to Dr Michaell Cowing about the pt and he said the pt needed to be sitting in a warm sitz bath 6-10 times a day along with taking her pain medicine. I advised pt that she needed to take her pain medicine as directed if things get worse she needs to call us and let us know b/c she will need to be seen by Dr Michaell Cowing. I advised the pt that she needed to get her diarrhea under control by taking some Immodium along with fluids so she doesn't get dehydrated. The pt understands and knows to call us if things don't get any better.

## 2012-12-09 ENCOUNTER — Telehealth (INDEPENDENT_AMBULATORY_CARE_PROVIDER_SITE_OTHER): Payer: Self-pay

## 2012-12-09 NOTE — Telephone Encounter (Signed)
Pt calling for a refill on her Oxycodone 5mg  b/c she will run out over the weekend. The pt has a f/u appt with Dr Michaell Cowing next week.

## 2012-12-09 NOTE — Telephone Encounter (Signed)
Called pt to let her know there is a written rx for Oxycodone 5mg  #60 take 2-4 tabs every 6hrs prn pain. I notified pt that Dr Dwain Sarna is the physician signing the rx for Dr Michaell Cowing b/c he is not in the office today. The pt can p/u the rx at the front desk. The pt asked for me to call her pharmacy [facility] on [address] to let them know about the rx being signed by Dr Gordy Savers partner so she doesn't have any issues getting it filled this weekend. I will call [facility].  I called [facility] and spoke to the pharmacist to let him know about the pt getting another rx from our office but Dr Dwain Sarna would be signing physician. The pharmacist said that would be ok.

## 2012-12-09 NOTE — Telephone Encounter (Signed)
Make it so. 2-4 q6h PRN pain #60.  Refill = 0

## 2012-12-12 ENCOUNTER — Telehealth (INDEPENDENT_AMBULATORY_CARE_PROVIDER_SITE_OTHER): Payer: Self-pay | Admitting: *Deleted

## 2012-12-12 NOTE — Telephone Encounter (Signed)
Patient states yesterday she went to pass gas and felt like something ripped.  Patient reports redness and burning to rectal area.  Patient reports she had her mother look at the area and she think she needs a new hemorrhoid.

## 2012-12-14 ENCOUNTER — Ambulatory Visit (INDEPENDENT_AMBULATORY_CARE_PROVIDER_SITE_OTHER): Payer: Medicaid Other | Admitting: Surgery

## 2012-12-14 ENCOUNTER — Encounter (INDEPENDENT_AMBULATORY_CARE_PROVIDER_SITE_OTHER): Payer: Self-pay | Admitting: Surgery

## 2012-12-14 VITALS — BP 100/62 | HR 74 | Resp 18 | Ht 62.0 in | Wt 108.0 lb

## 2012-12-14 DIAGNOSIS — Z720 Tobacco use: Secondary | ICD-10-CM

## 2012-12-14 DIAGNOSIS — K648 Other hemorrhoids: Secondary | ICD-10-CM

## 2012-12-14 DIAGNOSIS — F17200 Nicotine dependence, unspecified, uncomplicated: Secondary | ICD-10-CM

## 2012-12-14 DIAGNOSIS — K5909 Other constipation: Secondary | ICD-10-CM

## 2012-12-14 DIAGNOSIS — K5900 Constipation, unspecified: Secondary | ICD-10-CM

## 2012-12-14 MED ORDER — NAPROXEN 500 MG PO TABS: 500.0000 mg | ORAL_TABLET | Freq: Two times a day (BID) | ORAL | Status: AC

## 2012-12-14 MED ORDER — OXYCODONE HCL 5 MG PO TABS: 5.0000 mg | ORAL_TABLET | Freq: Four times a day (QID) | ORAL | Status: DC | PRN

## 2012-12-14 MED ORDER — POLYETHYLENE GLYCOL 3350 17 G PO PACK: 17.0000 g | PACK | Freq: Two times a day (BID) | ORAL | Status: DC

## 2012-12-14 NOTE — Progress Notes (Signed)
Subjective:     Patient ID: Vickie Doyle, female   DOB: 05-Apr-1976, 37 y.o.   MRN: [id number]  HPI  Vickie Doyle  13-Mar-1976 [id number]  Patient has no care team.  This patient is a 37 y.o.female who presents today for surgical evaluation s/p THD hemorrhoidal ligation/pexy 12/02/2012  The patient comes in today still struggling but getting better.  She has had a fair amount of pain.  Taking a lot of oxycodone.  Taking ibuprofen 800 3 times a day.  Using a lot of warm soaks.  Trying to avoid constipation.  Taking Colace and Metamucil and MiraLAX.  No fevers or chills.  No rectal bleeding.  Thought she felt a tear with one bowel movement.  Does note a sensation of some lumps/hemorrhoids perianally.  Feels somewhat sedated with the oxycodone but it does control her pain.  Patient Active Problem List  Diagnosis  . Internal hemorrhoids with chronic prolapse/bleeding and pain  . Chronic constipation  . Tobacco abuse    Past Medical History  Diagnosis Date  . Hemorrhoids   . COPD (chronic obstructive pulmonary disease)     no current inhaler use  . Shortness of breath     last 4 to 5 months  . Chest pain 11-28-2012  . GERD (gastroesophageal reflux disease)   . Depression     started on mitrazapine 11-30-2012    Past Surgical History  Procedure Laterality Date  . Tubal ligation  yrs ago  . Cryoablation of uterus  age 41  . Tonsillectomy  age 56    t and a  . Colonscopy  2010  . Transanal hemorrhoidal dearterialization N/A 12/02/2012    Procedure: TRANSANAL HEMORRHOIDAL DEARTERIALIZATION ligation pexy ;  Surgeon: Ardeth Sportsman, MD;  Location: [facility]S;  Service: General;  Laterality: N/A;    History   Social History  . Marital Status: Married    Spouse Name: N/A    Number of Children: N/A  . Years of Education: N/A   Occupational History  . Not on file.   Social History Main Topics  . Smoking status: Current Every Day Smoker -- 0.50 packs/day for 15 years    Types:  Cigarettes  . Smokeless tobacco: Never Used  . Alcohol Use: No  . Drug Use: Yes    Special: Marijuana  . Sexually Active: Not on file   Other Topics Concern  . Not on file   Social History Narrative  . No narrative on file    Family History  Problem Relation Age of Onset  . Heart disease Sister     Current Outpatient Prescriptions  Medication Sig Dispense Refill  . Cyanocobalamin (VITAMIN B-12 PO) Take 2,500 mcg by mouth 2 (two) times daily.      . hydrocortisone-pramoxine (ANALPRAM-HC) 2.5-1 % rectal cream Place rectally 4 (four) times daily. For irritated and painful hemorrhoids  15 g  2  . mirtazapine (REMERON) 15 MG tablet Take 15 mg by mouth at bedtime.      Marland Kitchen oxyCODONE (OXY IR/ROXICODONE) 5 MG immediate release tablet Take 1-3 tablets (5-15 mg total) by mouth every 6 (six) hours as needed for pain.  50 tablet  0  . polyethylene glycol (MIRALAX / GLYCOLAX) packet Take 17 g by mouth 2 (two) times daily.  14 each  5  . pramoxine (PROCTOFOAM) 1 % foam Place rectally every 2 (two) hours as needed for hemorrhoids.  15 g  0  . tamsulosin (FLOMAX) 0.4 MG CAPS Take  1 capsule (0.4 mg total) by mouth daily.  7 capsule  0   No current facility-administered medications for this visit.     Allergies  Allergen Reactions  . Penicillins Hives  . Trazodone And Nefazodone Nausea And Vomiting    cramping  . Tramadol Rash    BP 100/62  Pulse 74  Resp 18  Ht 5\' 2"  (1.575 m)  Wt 108 lb (48.988 kg)  BMI 19.75 kg/m2  LMP 11/24/2012  Dg Chest 2 View  11/29/2012  *RADIOLOGY REPORT*  Clinical Data: Preop hemorrhoid surgery  CHEST - 2 VIEW  Comparison: None.  Findings: Normal mediastinum and cardiac silhouette.  Normal pulmonary  vasculature.  No evidence of effusion, infiltrate, or pneumothorax.  No acute bony abnormality.  IMPRESSION: No acute cardiopulmonary process.   Original Report Authenticated By: Genevive Bi, M.D.      Review of Systems  Constitutional: Negative for fever,  chills and diaphoresis.  HENT: Negative for ear pain, sore throat and trouble swallowing.   Eyes: Negative for photophobia and visual disturbance.  Respiratory: Negative for cough and choking.   Cardiovascular: Negative for chest pain and palpitations.  Gastrointestinal: Positive for rectal pain. Negative for nausea, vomiting, abdominal pain, diarrhea, constipation and anal bleeding.  Genitourinary: Negative for dysuria, frequency and difficulty urinating.  Musculoskeletal: Negative for myalgias and gait problem.  Skin: Negative for color change, pallor and rash.  Neurological: Negative for dizziness, speech difficulty, weakness and numbness.  Hematological: Negative for adenopathy.  Psychiatric/Behavioral: Negative for confusion and agitation. The patient is not nervous/anxious.        Objective:   Physical Exam  Constitutional: She is oriented to person, place, and time. She appears well-developed and well-nourished. No distress.  HENT:  Head: Normocephalic.  Mouth/Throat: Oropharynx is clear and moist. No oropharyngeal exudate.  Eyes: Conjunctivae and EOM are normal. Pupils are equal, round, and reactive to light. No scleral icterus.  Neck: Normal range of motion. No tracheal deviation present.  Cardiovascular: Normal rate and intact distal pulses.   Pulmonary/Chest: Effort normal. No respiratory distress. She exhibits no tenderness.  Abdominal: Soft. She exhibits no distension. There is no tenderness. Hernia confirmed negative in the right inguinal area and confirmed negative in the left inguinal area.  Incisions clean with normal healing ridges.  No hernias  Genitourinary: No vaginal discharge found.  Exam done with assistance of female Medical Assistant in the room.  Perianal skin clean with good hygiene.  No pruritis.  Few small sensitive external skin hemorrhoids.  No pilonidal disease.  No fissure.  No abscess/fistula.       Musculoskeletal: Normal range of motion. She  exhibits no tenderness.  Lymphadenopathy:       Right: No inguinal adenopathy present.       Left: No inguinal adenopathy present.  Neurological: She is alert and oriented to person, place, and time. No cranial nerve deficit. She exhibits normal muscle tone. Coordination normal.  Skin: Skin is warm and dry. No rash noted. She is not diaphoretic.  Psychiatric: She has a normal mood and affect. Her behavior is normal.       Assessment:     10 days s/p THD hemorrhoidal ligation/pexy with pain in smoking female struggling but starting to get better    Plan:     Increase activity as tolerated to regular activity.  Do not push through pain.  I renewed oxycodone.  Consider switching to Aleve.  Continue warm soaks.  I tried to reassure her that  mild swelling is normal.  That should gradually fade away.  Off on any anoscopy or other examination is she continues to improve.  Diet as tolerated. Bowel regimen to avoid problems.I recommend she just stick to MiraLAX only.  Having three different medications may be too stressful.  Wean off Metamucil and docusate.  Increase MiraLAX to compensate.  Return to clinic 2-3weeks.   Instructions discussed.  Followup with primary care physician for other health issues as would normally be done.  Questions answered.  The patient expressed understanding and appreciation

## 2012-12-14 NOTE — Patient Instructions (Signed)
ANORECTAL SURGERY: POST OP INSTRUCTIONS  1. Take your usually prescribed home medications unless otherwise directed. 2. DIET: Follow a light bland diet the first 24 hours after arrival home, such as soup, liquids, crackers, etc.  Be sure to include lots of fluids daily.  Avoid fast food or heavy meals as your are more likely to get nauseated.  Eat a low fat the next few days after surgery.   3. PAIN CONTROL: a. Pain is best controlled by a usual combination of three different methods TOGETHER: i. Ice/Heat ii. Over the counter pain medication iii. Prescription pain medication b. Most patients will experience some swelling and discomfort in the anus/rectal area. and incisions.  Ice packs or heat (30-60 minutes up to 6 times a day) will help. Use ice for the first few days to help decrease swelling and bruising, then switch to heat such as warm towels, sitz baths, warm baths, etc to help relax tight/sore spots and speed recovery.  Some people prefer to use ice alone, heat alone, alternating between ice & heat.  Experiment to what works for you.  Swelling and bruising can take several weeks to resolve.   c. It is helpful to take an over-the-counter pain medication regularly for the first few weeks.  Choose one of the following that works best for you: i. Naproxen (Aleve, etc)  Two 220mg tabs twice a day ii. Ibuprofen (Advil, etc) Three 200mg tabs four times a day (every meal & bedtime) iii. Acetaminophen (Tylenol, etc) 500-650mg four times a day (every meal & bedtime) d. A  prescription for pain medication (such as oxycodone, hydrocodone, etc) should be given to you upon discharge.  Take your pain medication as prescribed.  i. If you are having problems/concerns with the prescription medicine (does not control pain, nausea, vomiting, rash, itching, etc), please call us [phone] to see if we need to switch you to a different pain medicine that will work better for you and/or control your side effect  better. ii. If you need a refill on your pain medication, please contact your pharmacy.  They will contact our office to request authorization. Prescriptions will not be filled after 5 pm or on week-ends. 4. KEEP YOUR BOWELS REGULAR a. The goal is one bowel movement a day b. Avoid getting constipated.  Between the surgery and the pain medications, it is common to experience some constipation.  Increasing fluid intake and taking a fiber supplement (such as Metamucil, Citrucel, FiberCon, MiraLax, etc) 1-2 times a day regularly will usually help prevent this problem from occurring.  A mild laxative (prune juice, Milk of Magnesia, MiraLax, etc) should be taken according to package directions if there are no bowel movements after 48 hours. c. Watch out for diarrhea.  If you have many loose bowel movements, simplify your diet to bland foods & liquids for a few days.  Stop any stool softeners and decrease your fiber supplement.  Switching to mild anti-diarrheal medications (Kayopectate, Pepto Bismol) can help.  If this worsens or does not improve, please call us.  5. Wound Care a. Remove your bandages the day after surgery.  Unless discharge instructions indicate otherwise, leave your bandage dry and in place overnight.  Remove the bandage during your first bowel movement.   b. Allow the wound packing to fall out over the next few days.  You can trim exposed gauze / ribbon as it falls out.  You do not need to repack the wound unless instructed otherwise.  Wear an   absorbent pad or soft cotton gauze in your underwear as needed to catch any drainage and help keep the area  c. Keep the area clean and dry.  Bathe / shower every day.  Keep the area clean by showering / bathing over the incision / wound.   It is okay to soak an open wound to help wash it.  Wet wipes or showers / gentle washing after bowel movements is often less traumatic than regular toilet paper. d. Bonita Quin may have some styrofoam-like soft packing in  the rectum which will come out with the first bowel movement.  e. You will often notice bleeding with bowel movements.  This should slow down by the end of the first week of surgery f. Expect some drainage.  This should slow down, too, by the end of the first week of surgery.  Wear an absorbent pad or soft cotton gauze in your underwear until the drainage stops. 6. ACTIVITIES as tolerated:   a. You may resume regular (light) daily activities beginning the next day-such as daily self-care, walking, climbing stairs-gradually increasing activities as tolerated.  If you can walk 30 minutes without difficulty, it is safe to try more intense activity such as jogging, treadmill, bicycling, low-impact aerobics, swimming, etc. b. Save the most intensive and strenuous activity for last such as sit-ups, heavy lifting, contact sports, etc  Refrain from any heavy lifting or straining until you are off narcotics for pain control.   c. DO NOT PUSH THROUGH PAIN.  Let pain be your guide: If it hurts to do something, don't do it.  Pain is your body warning you to avoid that activity for another week until the pain goes down. d. You may drive when you are no longer taking prescription pain medication, you can comfortably sit for long periods of time, and you can safely maneuver your car and apply brakes. e. Bonita Quin may have sexual intercourse when it is comfortable.  7. FOLLOW UP in our office a. Please call [facility] at [phone] to set up an appointment to see your surgeon in the office for a follow-up appointment approximately 2 weeks after your surgery. b. Make sure that you call for this appointment the day you arrive home to insure a convenient appointment time. 10. IF YOU HAVE DISABILITY OR FAMILY LEAVE FORMS, BRING THEM TO THE OFFICE FOR PROCESSING.  DO NOT GIVE THEM TO YOUR DOCTOR.        WHEN TO CALL us [phone]: 1. Poor pain control 2. Reactions / problems with new medications (rash/itching, nausea,  etc)  3. Fever over 101.5 F (38.5 C) 4. Inability to urinate 5. Nausea and/or vomiting 6. Worsening swelling or bruising 7. Continued bleeding from incision. 8. Increased pain, redness, or drainage from the incision  The clinic staff is available to answer your questions during regular business hours (8:30am-5pm).  Please don't hesitate to call and ask to speak to one of our nurses for clinical concerns.   A surgeon from [facility] is always on call at the hospitals   If you have a medical emergency, go to the nearest emergency room or call 911.    [facility] [address], [place], [place]  [address] ? MAIN: [phone] ? TOLL FREE: [phone] ? FAX [phone] [email or web address]   Managing Pain  Pain after surgery or related to activity is often due to strain/injury to muscle, tendon, nerves and/or incisions.  This pain is usually short-term  and will improve in a few months.   Many people find it helpful to do the following things TOGETHER to help speed the process of healing and to get back to regular activity more quickly:  1. Avoid heavy physical activity a.  no lifting greater than 20 pounds b. Do not "push through" the pain.  Listen to your body and avoid positions and maneuvers than reproduce the pain c. Walking is okay as tolerated, but go slowly and stop when getting sore.  d. Remember: If it hurts to do it, then don't do it! 2. Take Anti-inflammatory medication  a. Take with food/snack around the clock for 1-2 weeks i. This helps the muscle and nerve tissues become less irritable and calm down faster b. Choose ONE of the following over-the-counter medications: i. Naproxen 220mg  tabs (ex. Aleve) 1-2 pills twice a day  ii. Ibuprofen 200mg  tabs (ex. Advil, Motrin) 3-4 pills with every meal and just before bedtime iii. Acetaminophen 500mg  tabs (Tylenol) 1-2 pills with every meal and just before  bedtime 3. Use a Heating pad or Ice/Cold Pack a. 4-6 times a day b. May use warm bath/hottub  or showers 4. Try Gentle Massage and/or Stretching  a. at the area of pain many times a day b. stop if you feel pain - do not overdo it  Try these steps together to help you body heal faster and avoid making things get worse.  Doing just one of these things may not be enough.    If you are not getting better after two weeks or are noticing you are getting worse, contact our office for further advice; we may need to re-evaluate you & see what other things we can do to help.  GETTING TO GOOD BOWEL HEALTH. Irregular bowel habits such as constipation and diarrhea can lead to many problems over time.  Having one soft bowel movement a day is the most important way to prevent further problems.  The anorectal canal is designed to handle stretching and feces to safely manage our ability to get rid of solid waste (feces, poop, stool) out of our body.  BUT, hard constipated stools can act like ripping concrete bricks and diarrhea can be a burning fire to this very sensitive area of our body, causing inflamed hemorrhoids, anal fissures, increasing risk is perirectal abscesses, abdominal pain/bloating, an making irritable bowel worse.     The goal: ONE SOFT BOWEL MOVEMENT A DAY!  To have soft, regular bowel movements:    Drink at least 8 tall glasses of water a day.     Take plenty of fiber.  Fiber is the undigested part of plant food that passes into the colon, acting s "natures broom" to encourage bowel motility and movement.  Fiber can absorb and hold large amounts of water. This results in a larger, bulkier stool, which is soft and easier to pass. Work gradually over several weeks up to 6 servings a day of fiber (25g a day even more if needed) in the form of: o Vegetables -- Root (potatoes, carrots, turnips), leafy green (lettuce, salad greens, celery, spinach), or cooked high residue (cabbage, broccoli,  etc) o Fruit -- Fresh (unpeeled skin & pulp), Dried (prunes, apricots, cherries, etc ),  or stewed ( applesauce)  o Whole grain breads, pasta, etc (whole wheat)  o Bran cereals    Bulking Agents -- This type of water-retaining fiber generally is easily obtained each day by one of the following:  o Psyllium bran --  The psyllium plant is remarkable because its ground seeds can retain so much water. This product is available as Metamucil, Konsyl, Effersyllium, Per Diem Fiber, or the less expensive generic preparation in drug and health food stores. Although labeled a laxative, it really is not a laxative.  o Methylcellulose -- This is another fiber derived from wood which also retains water. It is available as Citrucel. o Polyethylene Glycol - and "artificial" fiber commonly called Miralax or Glycolax.  It is helpful for people with gassy or bloated feelings with regular fiber o Flax Seed - a less gassy fiber than psyllium   No reading or other relaxing activity while on the toilet. If bowel movements take longer than 5 minutes, you are too constipated   AVOID CONSTIPATION.  High fiber and water intake usually takes care of this.  Sometimes a laxative is needed to stimulate more frequent bowel movements, but    Laxatives are not a good long-term solution as it can wear the colon out. o Osmotics (Milk of Magnesia, Fleets phosphosoda, Magnesium citrate, MiraLax, GoLytely) are safer than  o Stimulants (Senokot, Castor Oil, Dulcolax, Ex Lax)    o Do not take laxatives for more than 7days in a row.    IF SEVERELY CONSTIPATED, try a Bowel Retraining Program: o Do not use laxatives.  o Eat a diet high in roughage, such as bran cereals and leafy vegetables.  o Drink six (6) ounces of prune or apricot juice each morning.  o Eat two (2) large servings of stewed fruit each day.  o Take one (1) heaping tablespoon of a psyllium-based bulking agent twice a day. Use sugar-free sweetener when possible to avoid  excessive calories.  o Eat a normal breakfast.  o Set aside 15 minutes after breakfast to sit on the toilet, but do not strain to have a bowel movement.  o If you do not have a bowel movement by the third day, use an enema and repeat the above steps.    Controlling diarrhea o Switch to liquids and simpler foods for a few days to avoid stressing your intestines further. o Avoid dairy products (especially milk & ice cream) for a short time.  The intestines often can lose the ability to digest lactose when stressed. o Avoid foods that cause gassiness or bloating.  Typical foods include beans and other legumes, cabbage, broccoli, and dairy foods.  Every person has some sensitivity to other foods, so listen to our body and avoid those foods that trigger problems for you. o Adding fiber (Citrucel, Metamucil, psyllium, Miralax) gradually can help thicken stools by absorbing excess fluid and retrain the intestines to act more normally.  Slowly increase the dose over a few weeks.  Too much fiber too soon can backfire and cause cramping & bloating. o Probiotics (such as active yogurt, Align, etc) may help repopulate the intestines and colon with normal bacteria and calm down a sensitive digestive tract.  Most studies show it to be of mild help, though, and such products can be costly. o Medicines:   Bismuth subsalicylate (ex. Kayopectate, Pepto Bismol) every 30 minutes for up to 6 doses can help control diarrhea.  Avoid if pregnant.   Loperamide (Immodium) can slow down diarrhea.  Start with two tablets (4mg  total) first and then try one tablet every 6 hours.  Avoid if you are having fevers or severe pain.  If you are not better or start feeling worse, stop all medicines and call your doctor for advice  o Call your doctor if you are getting worse or not better.  Sometimes further testing (cultures, endoscopy, X-ray studies, bloodwork, etc) may be needed to help diagnose and treat the cause of the diarrhea. o

## 2012-12-19 ENCOUNTER — Telehealth (INDEPENDENT_AMBULATORY_CARE_PROVIDER_SITE_OTHER): Payer: Self-pay | Admitting: General Surgery

## 2012-12-19 ENCOUNTER — Telehealth (INDEPENDENT_AMBULATORY_CARE_PROVIDER_SITE_OTHER): Payer: Self-pay

## 2012-12-19 NOTE — Telephone Encounter (Signed)
Called pt back to let her know that Dr Michaell Cowing wants to switch her Oxycodone to Diluadid 4mg  #50. Dr Michaell Cowing is at the [facility] this week so Dr Magnus Ivan signed the rx for Dr Michaell Cowing. Copy made for chart. Rx at front desk for pt to p/u.

## 2012-12-19 NOTE — Telephone Encounter (Signed)
Patient called status post hemorrhoid surgery asking for a refill of oxycodone. She states she only has 5-6 pills left after refill on 12/14/2012. She states she has been taking the naproxen with this twice a day. She thinks the naproxen may be making her nauseated. She states she still has a lot of swelling and is still having rectal bleeding when she wipes and in the stool. She is doing sitz baths. Please advise.

## 2012-12-19 NOTE — Telephone Encounter (Signed)
Consider sitters switching to Dilaudid 4 mg pills 1-2 q6h prn pain #50.  Otherwise renew oxycodone 5mg  tabs 1-2 q 6h PRN #60.  I am [facility] this week so cannot come over and fill this out.  Please someone in the office can do it

## 2012-12-21 ENCOUNTER — Telehealth (INDEPENDENT_AMBULATORY_CARE_PROVIDER_SITE_OTHER): Payer: Self-pay

## 2012-12-21 NOTE — Telephone Encounter (Signed)
Make it so

## 2012-12-21 NOTE — Telephone Encounter (Signed)
LMOM for pt to p/u written rx for Oxycodone 5mg  #60 ordered by Dr Michaell Cowing but Dr Jamey Ripa wrote the rx since Dr Michaell Cowing at [facility].

## 2012-12-21 NOTE — Telephone Encounter (Signed)
Patient calling in stating the new pain medication Dilaudid 4 mg is too strong and causes her to vomit.  Patient reports taking with and without food she still continues to have nausea and vomiting.  Patient states she's tried taking ibuprofen, naproxen and tylenol with no relief.  Patient is requested a prescription for Oxycodone.  (Patient s/pTRANSANAL HEMORRHOIDAL DEARTERIALIZATION 12/02/12)

## 2012-12-29 ENCOUNTER — Telehealth (INDEPENDENT_AMBULATORY_CARE_PROVIDER_SITE_OTHER): Payer: Self-pay | Admitting: General Surgery

## 2012-12-29 NOTE — Telephone Encounter (Signed)
Pt called to ask for more pain meds before the weekend (when she will run out.)  Pt reports she is taking less now and is returning to work, but still uses them at home.  Discussed time to begin to wean off of them and she agrees to try the Hydrocodone 5/325 mg, # 30, 1 po Q6H prn pain, no refill.  Called to [employer]:  [phone].

## 2013-01-09 ENCOUNTER — Encounter (INDEPENDENT_AMBULATORY_CARE_PROVIDER_SITE_OTHER): Payer: Medicaid Other | Admitting: Surgery

## 2013-01-17 ENCOUNTER — Encounter (INDEPENDENT_AMBULATORY_CARE_PROVIDER_SITE_OTHER): Payer: Medicaid Other | Admitting: Surgery

## 2013-01-20 ENCOUNTER — Telehealth (INDEPENDENT_AMBULATORY_CARE_PROVIDER_SITE_OTHER): Payer: Self-pay | Admitting: *Deleted

## 2013-01-20 NOTE — Telephone Encounter (Signed)
Patient called to apologize for over sleeping for her last appt that she missed with Dr. Michaell Cowing and asked if she could reschedule.  Patient states she is having some bleeding from her bottom at this time which is bright red blood.  Patient instructed to use ice and soaks to try to help with the bleeding.  Appt made for PO appt 5/5 to see Gross MD.  Patient states understanding and agreeable at this time.

## 2013-01-23 ENCOUNTER — Encounter (INDEPENDENT_AMBULATORY_CARE_PROVIDER_SITE_OTHER): Payer: Medicaid Other | Admitting: Surgery

## 2013-01-31 ENCOUNTER — Encounter (INDEPENDENT_AMBULATORY_CARE_PROVIDER_SITE_OTHER): Payer: Medicaid Other | Admitting: Surgery

## 2013-02-02 ENCOUNTER — Encounter (INDEPENDENT_AMBULATORY_CARE_PROVIDER_SITE_OTHER): Payer: Self-pay | Admitting: Surgery

## 2013-02-16 ENCOUNTER — Encounter (INDEPENDENT_AMBULATORY_CARE_PROVIDER_SITE_OTHER): Payer: Self-pay | Admitting: Surgery

## 2013-11-03 ENCOUNTER — Emergency Department (HOSPITAL_BASED_OUTPATIENT_CLINIC_OR_DEPARTMENT_OTHER)
Admission: EM | Admit: 2013-11-03 | Discharge: 2013-11-03 | Disposition: A | Discharge status: Home / Self Care | Payer: Medicaid Other | Attending: Emergency Medicine | Admitting: Emergency Medicine

## 2013-11-03 ENCOUNTER — Encounter (HOSPITAL_BASED_OUTPATIENT_CLINIC_OR_DEPARTMENT_OTHER): Payer: Self-pay | Admitting: Emergency Medicine

## 2013-11-03 DIAGNOSIS — F17200 Nicotine dependence, unspecified, uncomplicated: Secondary | ICD-10-CM | POA: Insufficient documentation

## 2013-11-03 DIAGNOSIS — I889 Nonspecific lymphadenitis, unspecified: Secondary | ICD-10-CM | POA: Insufficient documentation

## 2013-11-03 DIAGNOSIS — J4489 Other specified chronic obstructive pulmonary disease: Secondary | ICD-10-CM | POA: Insufficient documentation

## 2013-11-03 DIAGNOSIS — Z8719 Personal history of other diseases of the digestive system: Secondary | ICD-10-CM | POA: Insufficient documentation

## 2013-11-03 DIAGNOSIS — Z79899 Other long term (current) drug therapy: Secondary | ICD-10-CM | POA: Insufficient documentation

## 2013-11-03 DIAGNOSIS — Z880 Allergy status to penicillin: Secondary | ICD-10-CM | POA: Insufficient documentation

## 2013-11-03 DIAGNOSIS — L539 Erythematous condition, unspecified: Secondary | ICD-10-CM | POA: Insufficient documentation

## 2013-11-03 DIAGNOSIS — K649 Unspecified hemorrhoids: Secondary | ICD-10-CM | POA: Insufficient documentation

## 2013-11-03 DIAGNOSIS — Z791 Long term (current) use of non-steroidal anti-inflammatories (NSAID): Secondary | ICD-10-CM | POA: Insufficient documentation

## 2013-11-03 DIAGNOSIS — F3289 Other specified depressive episodes: Secondary | ICD-10-CM | POA: Insufficient documentation

## 2013-11-03 DIAGNOSIS — J449 Chronic obstructive pulmonary disease, unspecified: Secondary | ICD-10-CM | POA: Insufficient documentation

## 2013-11-03 DIAGNOSIS — F329 Major depressive disorder, single episode, unspecified: Secondary | ICD-10-CM | POA: Insufficient documentation

## 2013-11-03 LAB — URINALYSIS, ROUTINE W REFLEX MICROSCOPIC
BILIRUBIN URINE: NEGATIVE
GLUCOSE, UA: NEGATIVE mg/dL
HGB URINE DIPSTICK: NEGATIVE
Ketones, ur: 15 mg/dL — AB
Leukocytes, UA: NEGATIVE
Nitrite: NEGATIVE
Protein, ur: NEGATIVE mg/dL
SPECIFIC GRAVITY, URINE: 1.014 (ref 1.005–1.030)
UROBILINOGEN UA: 1 mg/dL (ref 0.0–1.0)
pH: 6 (ref 5.0–8.0)

## 2013-11-03 LAB — CBC WITH DIFFERENTIAL/PLATELET
Basophils Absolute: 0 10*3/uL (ref 0.0–0.1)
Basophils Relative: 0 % (ref 0–1)
EOS ABS: 0.1 10*3/uL (ref 0.0–0.7)
EOS PCT: 1 % (ref 0–5)
HEMATOCRIT: 36.5 % (ref 36.0–46.0)
Hemoglobin: 12.7 g/dL (ref 12.0–15.0)
LYMPHS ABS: 3.5 10*3/uL (ref 0.7–4.0)
Lymphocytes Relative: 38 % (ref 12–46)
MCH: 33.2 pg (ref 26.0–34.0)
MCHC: 34.8 g/dL (ref 30.0–36.0)
MCV: 95.3 fL (ref 78.0–100.0)
MONO ABS: 0.9 10*3/uL (ref 0.1–1.0)
MONOS PCT: 9 % (ref 3–12)
Neutro Abs: 4.7 10*3/uL (ref 1.7–7.7)
Neutrophils Relative %: 51 % (ref 43–77)
PLATELETS: 279 10*3/uL (ref 150–400)
RBC: 3.83 MIL/uL — AB (ref 3.87–5.11)
RDW: 11.8 % (ref 11.5–15.5)
WBC: 9.2 10*3/uL (ref 4.0–10.5)

## 2013-11-03 LAB — BASIC METABOLIC PANEL
BUN: 6 mg/dL (ref 6–23)
CALCIUM: 9.1 mg/dL (ref 8.4–10.5)
CO2: 24 meq/L (ref 19–32)
CREATININE: 0.6 mg/dL (ref 0.50–1.10)
Chloride: 102 mEq/L (ref 96–112)
GFR calc Af Amer: >90 mL/min (ref >90)
GLUCOSE: 87 mg/dL (ref 70–99)
Potassium: 4.2 mEq/L (ref 3.7–5.3)
SODIUM: 138 meq/L (ref 137–147)

## 2013-11-03 MED ORDER — SULFAMETHOXAZOLE-TRIMETHOPRIM 800-160 MG PO TABS: 1.0000 | ORAL_TABLET | Freq: Two times a day (BID) | ORAL | Status: DC

## 2013-11-03 MED ORDER — HYDROCODONE-ACETAMINOPHEN 7.5-325 MG/15ML PO SOLN: 10.0000 mL | Freq: Four times a day (QID) | ORAL | Status: AC | PRN

## 2013-11-03 MED ORDER — NAPROXEN 250 MG PO TABS
375.0000 mg | ORAL_TABLET | Freq: Once | ORAL | Status: AC
  Administered 2013-11-03: 375 mg via ORAL
  Filled 2013-11-03: qty 2

## 2013-11-03 MED ORDER — MELOXICAM 7.5 MG PO TABS: 7.5000 mg | ORAL_TABLET | Freq: Every day | ORAL | Status: DC
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
## 2013-11-03 NOTE — ED Notes (Signed)
Lower abdominal pain since last night. Feels like she has an enlarged node in her left groin. Pain in her right groin.

## 2013-11-04 ENCOUNTER — Emergency Department (HOSPITAL_BASED_OUTPATIENT_CLINIC_OR_DEPARTMENT_OTHER)
Admission: EM | Admit: 2013-11-04 | Discharge: 2013-11-04 | Disposition: A | Discharge status: Home / Self Care | Payer: Medicaid Other | Attending: Emergency Medicine | Admitting: Emergency Medicine

## 2013-11-04 ENCOUNTER — Encounter (HOSPITAL_BASED_OUTPATIENT_CLINIC_OR_DEPARTMENT_OTHER): Payer: Self-pay | Admitting: Emergency Medicine

## 2013-11-04 DIAGNOSIS — Z79899 Other long term (current) drug therapy: Secondary | ICD-10-CM | POA: Insufficient documentation

## 2013-11-04 DIAGNOSIS — F3289 Other specified depressive episodes: Secondary | ICD-10-CM | POA: Insufficient documentation

## 2013-11-04 DIAGNOSIS — Z8719 Personal history of other diseases of the digestive system: Secondary | ICD-10-CM | POA: Insufficient documentation

## 2013-11-04 DIAGNOSIS — J4489 Other specified chronic obstructive pulmonary disease: Secondary | ICD-10-CM | POA: Insufficient documentation

## 2013-11-04 DIAGNOSIS — Z791 Long term (current) use of non-steroidal anti-inflammatories (NSAID): Secondary | ICD-10-CM | POA: Insufficient documentation

## 2013-11-04 DIAGNOSIS — J449 Chronic obstructive pulmonary disease, unspecified: Secondary | ICD-10-CM | POA: Insufficient documentation

## 2013-11-04 DIAGNOSIS — Z8679 Personal history of other diseases of the circulatory system: Secondary | ICD-10-CM | POA: Insufficient documentation

## 2013-11-04 DIAGNOSIS — Z880 Allergy status to penicillin: Secondary | ICD-10-CM | POA: Insufficient documentation

## 2013-11-04 DIAGNOSIS — F17200 Nicotine dependence, unspecified, uncomplicated: Secondary | ICD-10-CM | POA: Insufficient documentation

## 2013-11-04 DIAGNOSIS — F329 Major depressive disorder, single episode, unspecified: Secondary | ICD-10-CM | POA: Insufficient documentation

## 2013-11-04 DIAGNOSIS — Z760 Encounter for issue of repeat prescription: Secondary | ICD-10-CM | POA: Insufficient documentation

## 2013-11-04 NOTE — ED Notes (Signed)
Patient here requesting new prescription for pain medication. Patient has prescription from yesterdays visit that now has VOID written across same. Reports that her pain is worse and taking antibiotic as prescribed.

## 2013-11-04 NOTE — ED Provider Notes (Signed)
CSN: [id number]     Arrival date & time 11/04/13  0932 History   First MD Initiated Contact with Patient 11/04/13 [phone]     Chief Complaint  Patient presents with  . Follow-up     (Consider location/radiation/quality/duration/timing/severity/associated sxs/prior Treatment) HPI Pt presents with c/o wanting another narcotic prescription.  Pt states she was seen last night and given rx for hydrocodone and when she went to the pharmacy they would not fill the prescription.  She presents with a prescription that has VOID written on it.  Per chart review, Dr. Nicanor Alcon received a call from the pharmacy stating patient had recently had 60 tabs of oxycodone filled.  Pt endorses having these pills at home and states she takes them twice daily.   Past Medical History  Diagnosis Date  . Hemorrhoids   . COPD (chronic obstructive pulmonary disease)     no current inhaler use  . Shortness of breath     last 4 to 5 months  . Chest pain 11-28-2012  . GERD (gastroesophageal reflux disease)   . Depression     started on mitrazapine 11-30-2012   Past Surgical History  Procedure Laterality Date  . Tubal ligation  yrs ago  . Cryoablation of uterus  age 57  . Tonsillectomy  age 3    t and a  . Colonscopy  2010  . Transanal hemorrhoidal dearterialization N/A 12/02/2012    Procedure: TRANSANAL HEMORRHOIDAL DEARTERIALIZATION ligation pexy ;  Surgeon: Ardeth Sportsman, MD;  Location: [facility];  Service: General;  Laterality: N/A;   Family History  Problem Relation Age of Onset  . Heart disease Sister    History  Substance Use Topics  . Smoking status: Current Every Day Smoker -- 0.50 packs/day for 15 years    Types: Cigarettes  . Smokeless tobacco: Never Used  . Alcohol Use: No   OB History   Grav Para Term Preterm Abortions TAB SAB Ect Mult Living                 Review of Systems ROS reviewed and all otherwise negative except for mentioned in HPI    Allergies  Penicillins; Trazodone and  nefazodone; and Tramadol  Home Medications   Current Outpatient Rx  Name  Route  Sig  Dispense  Refill  . Cyanocobalamin (VITAMIN B-12 PO)   Oral   Take 2,500 mcg by mouth 2 (two) times daily.         Marland Kitchen HYDROcodone-acetaminophen (HYCET) 7.5-325 mg/15 ml solution   Oral   Take 10 mLs by mouth every 6 (six) hours as needed for moderate pain.   60 mL   0   . hydrocortisone-pramoxine (ANALPRAM-HC) 2.5-1 % rectal cream   Rectal   Place rectally 4 (four) times daily. For irritated and painful hemorrhoids   15 g   2   . mirtazapine (REMERON) 15 MG tablet   Oral   Take 15 mg by mouth at bedtime.         . naproxen (NAPROSYN) 500 MG tablet   Oral   Take 1 tablet (500 mg total) by mouth 2 (two) times daily with a meal.   60 tablet   2   . oxyCODONE (OXY IR/ROXICODONE) 5 MG immediate release tablet   Oral   Take 1-3 tablets (5-15 mg total) by mouth every 6 (six) hours as needed for pain.   50 tablet   0   . polyethylene glycol (MIRALAX / GLYCOLAX) packet  Oral   Take 17 g by mouth 2 (two) times daily.   14 each   5   . pramoxine (PROCTOFOAM) 1 % foam   Rectal   Place rectally every 2 (two) hours as needed for hemorrhoids.   15 g   0   . sulfamethoxazole-trimethoprim (SEPTRA DS) 800-160 MG per tablet   Oral   Take 1 tablet by mouth every 12 (twelve) hours.   14 tablet   0   . tamsulosin (FLOMAX) 0.4 MG CAPS   Oral   Take 1 capsule (0.4 mg total) by mouth daily.   7 capsule   0    BP 153/88  Pulse 101  Temp(Src) 98.9 F (37.2 C)  Resp 18  SpO2 100% Vitals reviewed Physical Exam Physical Examination: General appearance - alert, well appearing, and in no distress Mental status - alert, oriented to person, place, and time Eyes - no conjunctival injection, no scleral icterus Chest - clear to auscultation, no wheezes, rales or rhonchi, symmetric air entry Extremities - peripheral pulses normal, no pedal edema, no clubbing or cyanosis Skin - normal  coloration and turgor, no rashes Psych- normal mood and affect  ED Course  Procedures (including critical care time) Labs Review Labs Reviewed - No data to display Imaging Review No results found.  EKG Interpretation   None       MDM   Final diagnoses:  Medication refill    Pt presenting with request for another narcotic rx after pharmacy voided rx given last night due to the fact she had 60 oxycodone filled approx 2 weeks ago.  I have advised patient that she should use the pain medicaiton she has for pain, take the antibiotics prescribed last night and this will help with pain by treating the infection.  Explained that she needed to talk with her primary doctor if she needs additional pain medications.  Pt verbalized understanding.  Discharged with strict return precautions.  Pt agreeable with plan.    Ethelda ChickMartha K Linker, MD 11/04/13 1332

## 2013-11-04 NOTE — Discharge Instructions (Signed)
Return to the ED with any concerns including fever, increased pain, vomiting and not able to keep down liquids, decreased level of alertness/lethargy, or any other alarming symptoms

## 2014-03-08 ENCOUNTER — Other Ambulatory Visit: Payer: Self-pay

## 2014-03-08 ENCOUNTER — Emergency Department (HOSPITAL_BASED_OUTPATIENT_CLINIC_OR_DEPARTMENT_OTHER): Payer: Medicaid Other

## 2014-03-08 ENCOUNTER — Emergency Department (HOSPITAL_BASED_OUTPATIENT_CLINIC_OR_DEPARTMENT_OTHER)
Admission: EM | Admit: 2014-03-08 | Discharge: 2014-03-08 | Discharge status: Against Medical Advice | Payer: Medicaid Other | Attending: Emergency Medicine | Admitting: Emergency Medicine

## 2014-03-08 ENCOUNTER — Encounter (HOSPITAL_BASED_OUTPATIENT_CLINIC_OR_DEPARTMENT_OTHER): Payer: Self-pay | Admitting: Emergency Medicine

## 2014-03-08 DIAGNOSIS — F329 Major depressive disorder, single episode, unspecified: Secondary | ICD-10-CM | POA: Insufficient documentation

## 2014-03-08 DIAGNOSIS — N912 Amenorrhea, unspecified: Secondary | ICD-10-CM | POA: Insufficient documentation

## 2014-03-08 DIAGNOSIS — Z792 Long term (current) use of antibiotics: Secondary | ICD-10-CM | POA: Insufficient documentation

## 2014-03-08 DIAGNOSIS — Z880 Allergy status to penicillin: Secondary | ICD-10-CM | POA: Insufficient documentation

## 2014-03-08 DIAGNOSIS — IMO0002 Reserved for concepts with insufficient information to code with codable children: Secondary | ICD-10-CM | POA: Insufficient documentation

## 2014-03-08 DIAGNOSIS — J449 Chronic obstructive pulmonary disease, unspecified: Secondary | ICD-10-CM | POA: Insufficient documentation

## 2014-03-08 DIAGNOSIS — J4489 Other specified chronic obstructive pulmonary disease: Secondary | ICD-10-CM | POA: Insufficient documentation

## 2014-03-08 DIAGNOSIS — F17200 Nicotine dependence, unspecified, uncomplicated: Secondary | ICD-10-CM | POA: Insufficient documentation

## 2014-03-08 DIAGNOSIS — R0789 Other chest pain: Secondary | ICD-10-CM | POA: Insufficient documentation

## 2014-03-08 DIAGNOSIS — F3289 Other specified depressive episodes: Secondary | ICD-10-CM | POA: Insufficient documentation

## 2014-03-08 DIAGNOSIS — Z8679 Personal history of other diseases of the circulatory system: Secondary | ICD-10-CM | POA: Insufficient documentation

## 2014-03-08 DIAGNOSIS — Z8719 Personal history of other diseases of the digestive system: Secondary | ICD-10-CM | POA: Insufficient documentation

## 2014-03-08 LAB — CBC WITH DIFFERENTIAL/PLATELET
BASOS ABS: 0 10*3/uL (ref 0.0–0.1)
Basophils Relative: 0 % (ref 0–1)
Eosinophils Absolute: 0.1 10*3/uL (ref 0.0–0.7)
Eosinophils Relative: 1 % (ref 0–5)
HCT: 38 % (ref 36.0–46.0)
Hemoglobin: 13.4 g/dL (ref 12.0–15.0)
LYMPHS ABS: 3.6 10*3/uL (ref 0.7–4.0)
LYMPHS PCT: 47 % — AB (ref 12–46)
MCH: 33.4 pg (ref 26.0–34.0)
MCHC: 35.3 g/dL (ref 30.0–36.0)
MCV: 94.8 fL (ref 78.0–100.0)
MONO ABS: 0.8 10*3/uL (ref 0.1–1.0)
MONOS PCT: 10 % (ref 3–12)
NEUTROS ABS: 3.2 10*3/uL (ref 1.7–7.7)
Neutrophils Relative %: 41 % — ABNORMAL LOW (ref 43–77)
Platelets: 268 10*3/uL (ref 150–400)
RBC: 4.01 MIL/uL (ref 3.87–5.11)
RDW: 12 % (ref 11.5–15.5)
WBC: 7.7 10*3/uL (ref 4.0–10.5)

## 2014-03-08 LAB — COMPREHENSIVE METABOLIC PANEL
ALK PHOS: 70 U/L (ref 39–117)
ALT: 10 U/L (ref 0–35)
AST: 13 U/L (ref 0–37)
Albumin: 4.4 g/dL (ref 3.5–5.2)
BUN: 9 mg/dL (ref 6–23)
CO2: 24 meq/L (ref 19–32)
Calcium: 9.3 mg/dL (ref 8.4–10.5)
Chloride: 102 mEq/L (ref 96–112)
Creatinine, Ser: 0.7 mg/dL (ref 0.50–1.10)
GFR calc non Af Amer: >90 mL/min (ref >90)
GLUCOSE: 99 mg/dL (ref 70–99)
POTASSIUM: 3.6 meq/L — AB (ref 3.7–5.3)
SODIUM: 139 meq/L (ref 137–147)
TOTAL PROTEIN: 7.5 g/dL (ref 6.0–8.3)
Total Bilirubin: 0.5 mg/dL (ref 0.3–1.2)

## 2014-03-08 LAB — TROPONIN I
Troponin I: <0.3 ng/mL (ref <0.30)
Troponin I: <0.3 ng/mL (ref <0.30)

## 2014-03-08 MED ORDER — ASPIRIN 81 MG PO CHEW
324.0000 mg | CHEWABLE_TABLET | Freq: Once | ORAL | Status: AC
  Administered 2014-03-08: 324 mg via ORAL
  Filled 2014-03-08: qty 4

## 2014-03-08 NOTE — ED Notes (Signed)
Pt's family out in hallway demanding that IV be removed and stating that they are "fixing to walk up out of here", EDP made aware.

## 2014-03-08 NOTE — ED Notes (Signed)
Pt. Reports she has felt a funny sensation in the L chest for approx. 3 DAYS AND IS NOW HAVING LEFT SIDE CHEST PAIN.  No nausea or vomiting.  L ast BM was yesterday.

## 2014-03-08 NOTE — ED Notes (Signed)
Second troponin I sent to lab for analysis, awaiting results, pt updated on poc

## 2014-03-08 NOTE — ED Provider Notes (Signed)
CSN: [id number]     Arrival date & time 03/08/14  1800 History  This chart was scribed for Doug Sou, MD by Charline Bills, ED Scribe. The patient was seen in room MH11/MH11. Patient's care was started at 6:30 PM.   Chief Complaint  Patient presents with  . Chest Pain   Patient is a 38 y.o. female presenting with chest pain. The history is provided by the patient. No language interpreter was used.  Chest Pain Associated symptoms: palpitations    HPI Comments: LEXANI CORONA is a 38 y.o. female, with a h/o COPD, who presents to the Emergency Department chest pain onset last night. Pt reports feeling a "funny beat" over the past 4 days. She reports experiencing  she had left-sided anterior chest pain 5 times today each episode lasts approximately 10 minutes. Pain is exacerbated with walking and taking deep breaths. Pain resolves spontaneously. Pt denies pain at this moment. She denies h/o DM, HTN, hypercholesterolemia. Pt's sister had MI at age 58 and her father has had two, one before age 38 and one in his 39s. Pt is a smoker, she reports smoking 4 cigarettes today. Pt currently takes Hydrocodone for osteoarthritis. Allergy to PCN, tramadol, naproxen.  Cardiac risk factors smoker family history Past Medical History  Diagnosis Date  . Hemorrhoids   . COPD (chronic obstructive pulmonary disease)     no current inhaler use  . Shortness of breath     last 4 to 5 months  . Chest pain 11-28-2012  . GERD (gastroesophageal reflux disease)   . Depression     started on mitrazapine 11-30-2012   Past Surgical History  Procedure Laterality Date  . Tubal ligation  yrs ago  . Cryoablation of uterus  age 13  . Tonsillectomy  age 89    t and a  . Colonscopy  2010  . Transanal hemorrhoidal dearterialization N/A 12/02/2012    Procedure: TRANSANAL HEMORRHOIDAL DEARTERIALIZATION ligation pexy ;  Surgeon: Ardeth Sportsman, MD;  Location: [facility];  Service: General;  Laterality: N/A;   Family History   Problem Relation Age of Onset  . Heart disease Sister    History  Substance Use Topics  . Smoking status: Current Every Day Smoker -- 0.50 packs/day for 15 years    Types: Cigarettes  . Smokeless tobacco: Never Used  . Alcohol Use: No   OB History   Grav Para Term Preterm Abortions TAB SAB Ect Mult Living                 Review of Systems  Constitutional: Negative.   HENT: Negative.   Respiratory: Negative.   Cardiovascular: Positive for chest pain and palpitations.  Gastrointestinal: Negative.   Genitourinary:       Amenorrheic has IUD  Musculoskeletal: Negative.   Skin: Negative.   Neurological: Negative.   Psychiatric/Behavioral: Negative.   All other systems reviewed and are negative.  Allergies  Naproxen; Penicillins; Trazodone and nefazodone; and Tramadol  Home Medications   Prior to Admission medications   Medication Sig Start Date End Date Taking? Authorizing Provider  Cyanocobalamin (VITAMIN B-12 PO) Take 2,500 mcg by mouth 2 (two) times daily.    Historical Provider, MD  HYDROcodone-acetaminophen (HYCET) 7.5-325 mg/15 ml solution Take 10 mLs by mouth every 6 (six) hours as needed for moderate pain. 11/03/13 11/03/14  April K Palumbo-Rasch, MD  hydrocortisone-pramoxine [facility]) 2.5-1 % rectal cream Place rectally 4 (four) times daily. For irritated and painful hemorrhoids 12/05/12   Viviann Spare  C. Gross, MD  mirtazapine (REMERON) 15 MG tablet Take 15 mg by mouth at bedtime.    Historical Provider, MD  oxyCODONE (OXY IR/ROXICODONE) 5 MG immediate release tablet Take 1-3 tablets (5-15 mg total) by mouth every 6 (six) hours as needed for pain. 12/14/12   Ardeth SportsmanSteven C. Gross, MD  polyethylene glycol [facility](MIRALAX / Ethelene HalGLYCOLAX) packet Take 17 g by mouth 2 (two) times daily. 12/14/12   Ardeth SportsmanSteven C. Gross, MD  pramoxine (PROCTOFOAM) 1 % foam Place rectally every 2 (two) hours as needed for hemorrhoids. 12/06/12   Ardeth SportsmanSteven C. Gross, MD  sulfamethoxazole-trimethoprim (SEPTRA DS) 800-160 MG per  tablet Take 1 tablet by mouth every 12 (twelve) hours. 11/03/13   April K Palumbo-Rasch, MD  tamsulosin (FLOMAX) 0.4 MG CAPS Take 1 capsule (0.4 mg total) by mouth daily. 12/03/12   Atilano InaEric M Wilson, MD   Triage Vitals: BP 143/90  Pulse 84  Temp(Src) 98 F (36.7 C) (Oral)  Resp 16  SpO2 100% Physical Exam  Nursing note and vitals reviewed. Constitutional: She appears well-developed and well-nourished.  HENT:  Head: Normocephalic and atraumatic.  Eyes: Conjunctivae are normal. Pupils are equal, round, and reactive to light.  Neck: Neck supple. No tracheal deviation present. No thyromegaly present.  Cardiovascular: Normal rate and regular rhythm.   No murmur heard. Pulmonary/Chest: Effort normal and breath sounds normal.  Abdominal: Soft. Bowel sounds are normal. She exhibits no distension. There is no tenderness.  Musculoskeletal: Normal range of motion. She exhibits no edema and no tenderness.  Neurological: She is alert. Coordination normal.  Skin: Skin is warm and dry. No rash noted.  Psychiatric: She has a normal mood and affect.    ED Course  Procedures (including critical care time) DIAGNOSTIC STUDIES: Oxygen Saturation is 100% on RA, normal by my interpretation.    COORDINATION OF CARE: 6:36 PM-Discussed treatment plan which includes heart monitor with pt at bedside and pt agreed to plan.   Labs Review Labs Reviewed  CBC WITH DIFFERENTIAL - Abnormal; Notable for the following:    Neutrophils Relative % 41 (*)    Lymphocytes Relative 47 (*)    All other components within normal limits  COMPREHENSIVE METABOLIC PANEL - Abnormal; Notable for the following:    Potassium 3.6 (*)    All other components within normal limits  TROPONIN I  TROPONIN I   Imaging Review Dg Chest 2 View  03/08/2014   CLINICAL DATA:  CHEST PAIN  EXAM: CHEST  2 VIEW  COMPARISON:  11/29/2012.  FINDINGS: Cardiopericardial silhouette within normal limits. Mediastinal contours normal. Trachea midline. No  airspace disease or effusion.  IMPRESSION: No active cardiopulmonary disease.   Electronically Signed   By: Andreas NewportGeoffrey  Lamke M.D.   On: 03/08/2014 19:22     EKG Interpretation None      Date: 03/08/2014  Rate: 70  Rhythm: normal sinus rhythm  QRS Axis: normal  Intervals: normal  ST/T Wave abnormalities: normal  Conduction Disutrbances: none  Narrative Interpretation: unremarkable  10 pm pt resting comfortably , no distres 1035 pmThe   Pt left there ED before I could reassess her and give instuctions  MDM   Final diagnoses:  None  Pt felt to be relatively low risk for ACS. Heart score 2. She did not wait for outpt referarlto cardiology.  I did coubncil her on smoking cessation Dx #1 atypical chest pain #2 tobaccoa abuse #3 palpitations   I personally performed the services described in this documentation, which was scribed in  my presence. The recorded information has been reviewed and is accurate.    Doug SouSam Jacubowitz, MD 03/08/14 2241

## 2014-03-08 NOTE — ED Notes (Signed)
Pt. Reports a large bruise approx. 3 wks ago in the L inner knee and then it just dissappeared per pt.

## 2014-10-20 IMAGING — CT CT PELVIS W/ CM
2 of 3 series · 17 of 46 positions shown, 19 images · IV contrast (APPLIED)
Comparison: None

CLINICAL DATA: Rectal bleeding.  Hemorrhoidal pain.  Query
regional infection.

CT PELVIS WITH CONTRAST
TECHNIQUE: Multidetector CT imaging of the pelvis was performed
using the standard protocol following the bolus administration of
intravenous contrast.
Contrast: 80mL OMNIPAQUE IOHEXOL 300 MG/ML  SOLN

[Series 2: pelvis 5.0 b31f · axial · 0.70mm/px · z∈[-316,-106]mm · 14 of 50 slices shown, 16 images]
[im 4/50  soft-tissue]
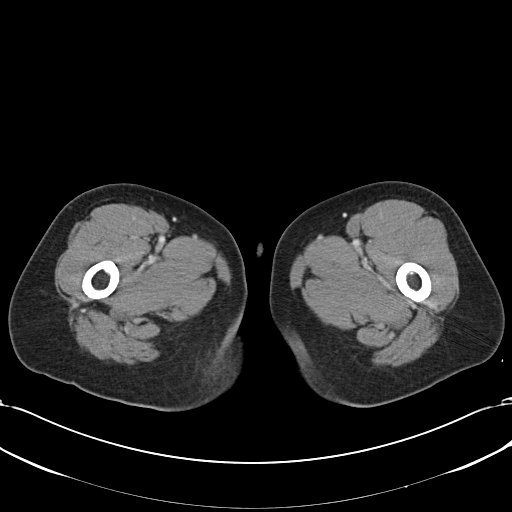
[im 4/50  bone]
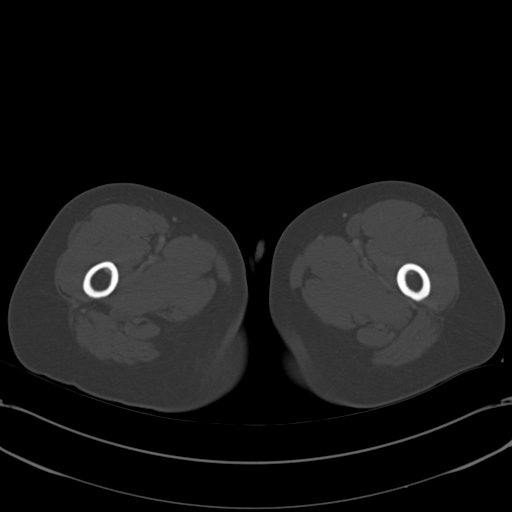
[im 7/50  soft-tissue]
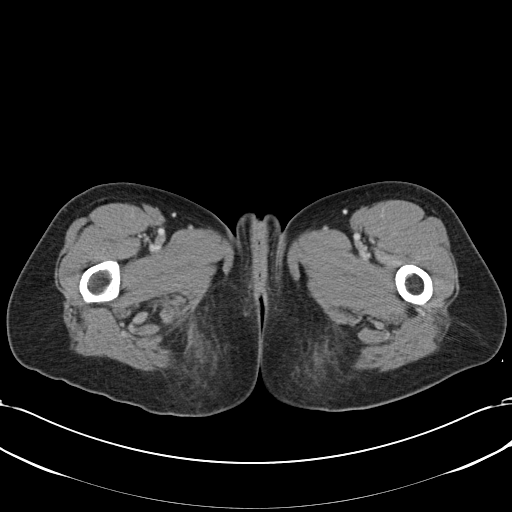
[im 10/50  soft-tissue]
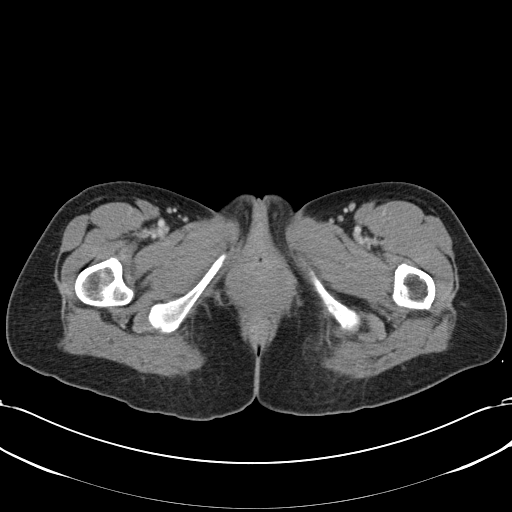
[im 13/50  soft-tissue]
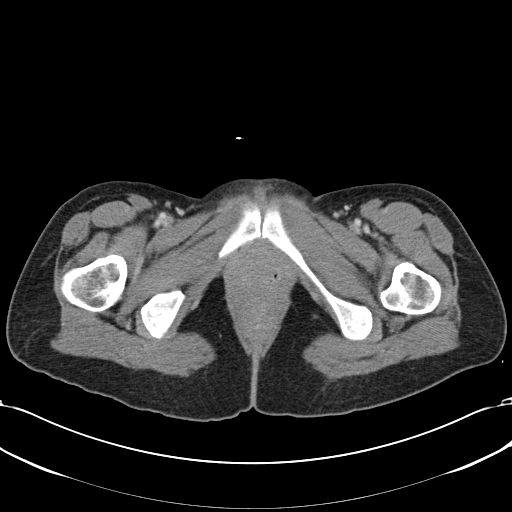
[im 16/50  soft-tissue]
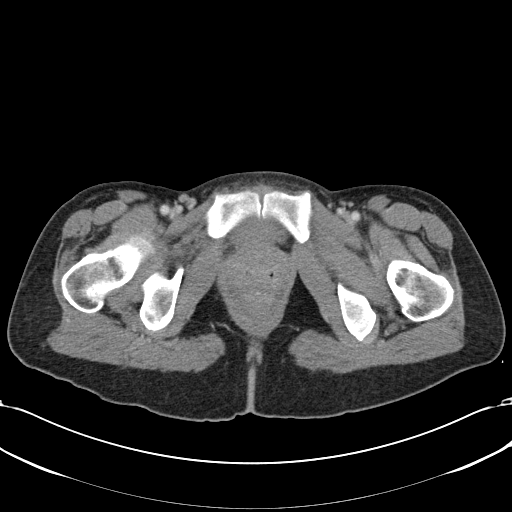
[im 19/50  soft-tissue]
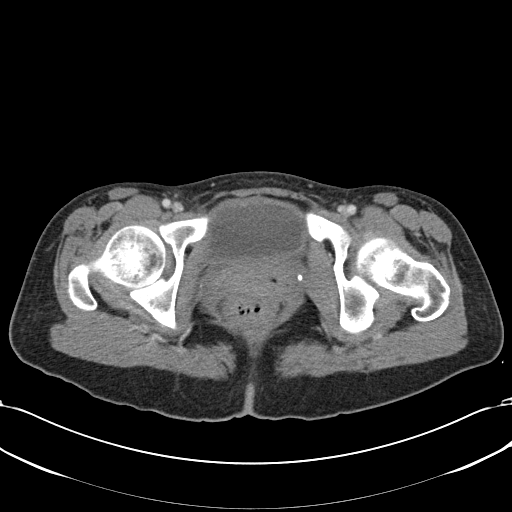
[im 23/50  soft-tissue]
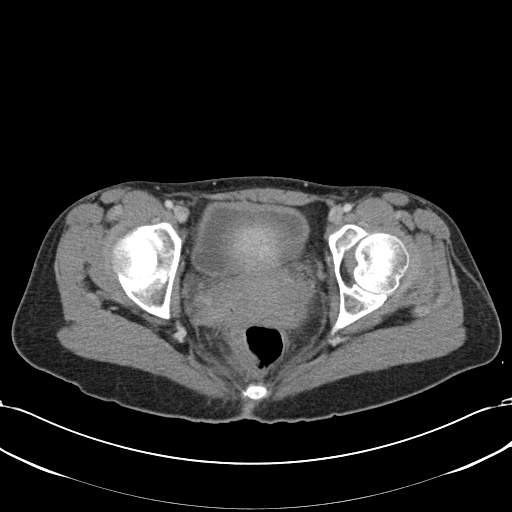
[im 27/50  soft-tissue]
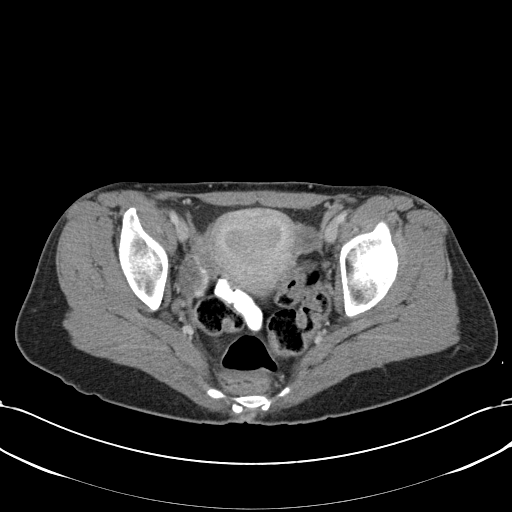
[im 31/50  soft-tissue]
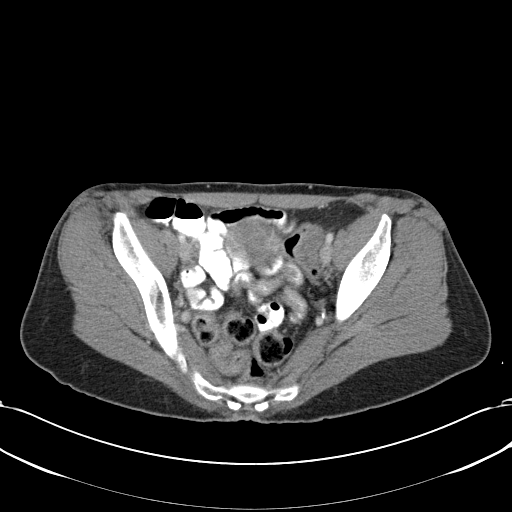
[im 31/50  bone]
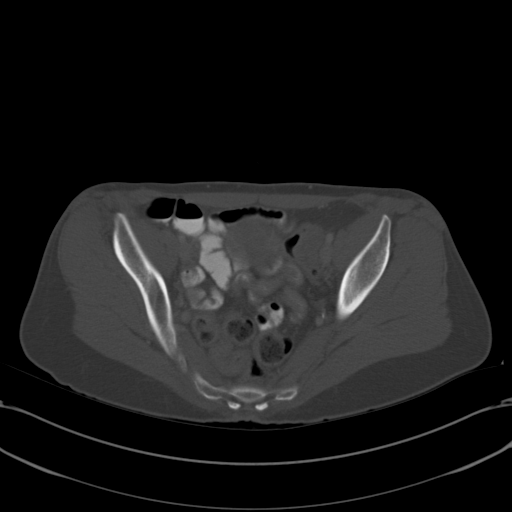
[im 34/50  soft-tissue]
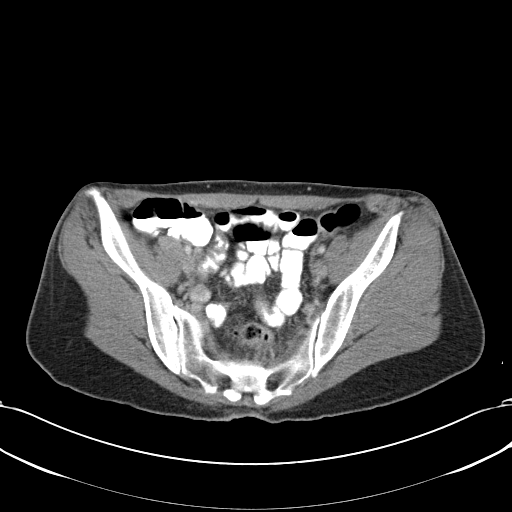
[im 37/50  soft-tissue]
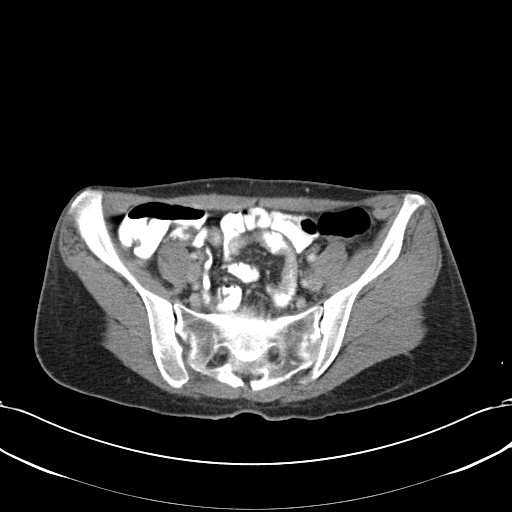
[im 40/50  soft-tissue]
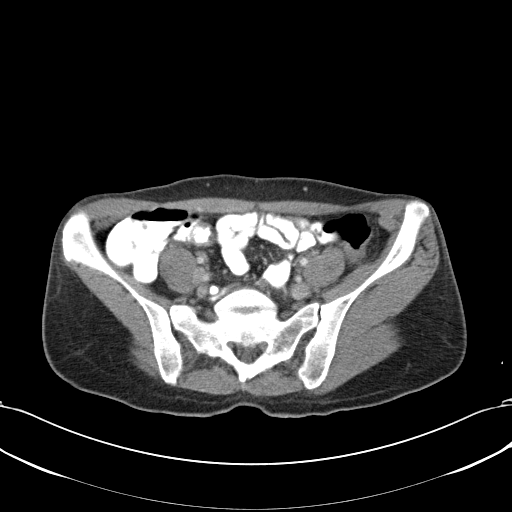
[im 43/50  soft-tissue]
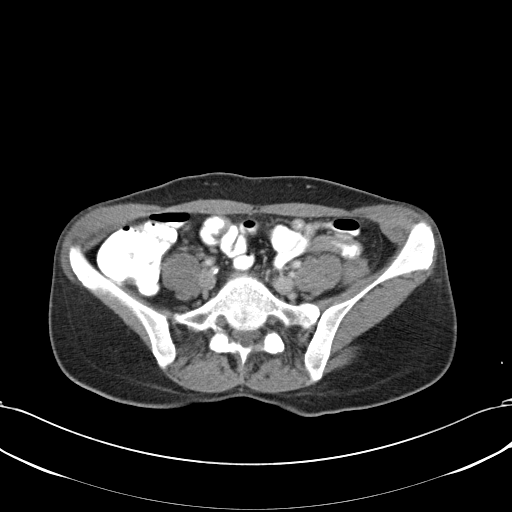
[im 46/50  soft-tissue]
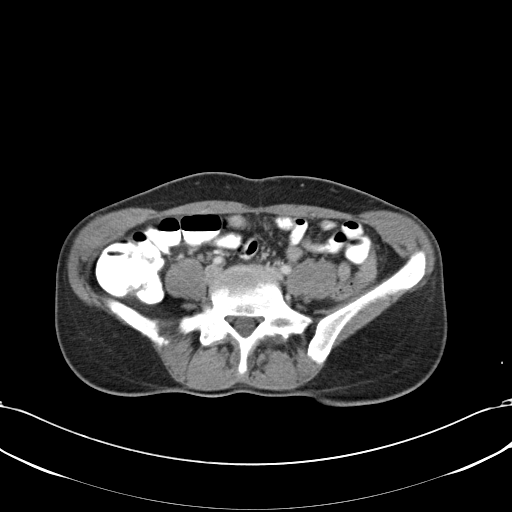

[Series 603: cor · coronal · 0.70mm/px · 3 of 88 slices shown]
[im 30/88  soft-tissue]
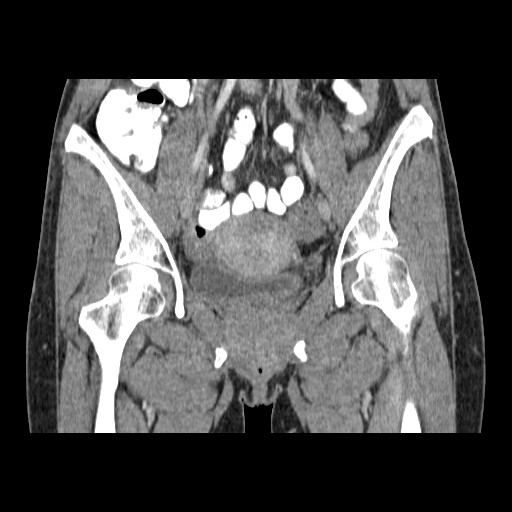
[im 39/88  soft-tissue]
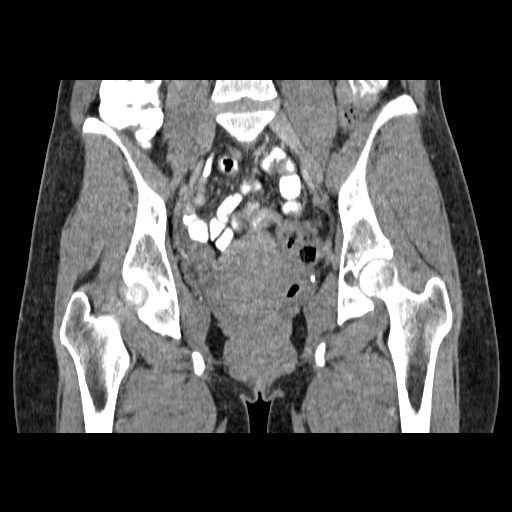
[im 49/88  soft-tissue]
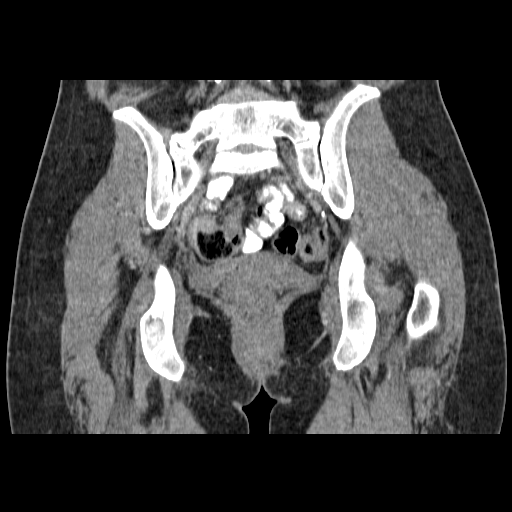

[17 of 46 positions shown; findings below may reference images not displayed]

FINDINGS: No abnormal rim enhancing structure in the perirectal
region to favor perirectal abscess.  Equivocal stranding in the
upper perirectal space may be secondary to mild localized
inflammation.  No abnormal stranding in the issue rectal fossa on
either side.  Posterior wall thickening in the uterus noted with
low density potentially from fibroid or adenomyosis.

Urinary bladder unremarkable.  Ovaries unremarkable. No pathologic
pelvic adenopathy is identified.

Orally administered contrast extends through to the cecum, and the
appendix appears normal.
IMPRESSION: 1.  No specific findings of perirectal abscess, although there is
some mild stranding in the upper perirectal space which could
reflect low-level inflammation.
2.  Posterior wall hypodensity and thickening in the uterus,
potentially from adenomyosis or an unusual fibroid.

## 2015-12-25 ENCOUNTER — Emergency Department (HOSPITAL_BASED_OUTPATIENT_CLINIC_OR_DEPARTMENT_OTHER)
Admission: EM | Admit: 2015-12-25 | Discharge: 2015-12-25 | Disposition: A | Discharge status: Home / Self Care | Payer: Medicaid Other | Attending: Emergency Medicine | Admitting: Emergency Medicine

## 2015-12-25 ENCOUNTER — Encounter (HOSPITAL_BASED_OUTPATIENT_CLINIC_OR_DEPARTMENT_OTHER): Payer: Self-pay

## 2015-12-25 DIAGNOSIS — Z880 Allergy status to penicillin: Secondary | ICD-10-CM | POA: Insufficient documentation

## 2015-12-25 DIAGNOSIS — F329 Major depressive disorder, single episode, unspecified: Secondary | ICD-10-CM | POA: Insufficient documentation

## 2015-12-25 DIAGNOSIS — M791 Myalgia: Secondary | ICD-10-CM | POA: Insufficient documentation

## 2015-12-25 DIAGNOSIS — F17210 Nicotine dependence, cigarettes, uncomplicated: Secondary | ICD-10-CM | POA: Insufficient documentation

## 2015-12-25 DIAGNOSIS — M7918 Myalgia, other site: Secondary | ICD-10-CM

## 2015-12-25 DIAGNOSIS — J449 Chronic obstructive pulmonary disease, unspecified: Secondary | ICD-10-CM | POA: Insufficient documentation

## 2015-12-25 DIAGNOSIS — Z8719 Personal history of other diseases of the digestive system: Secondary | ICD-10-CM | POA: Insufficient documentation

## 2015-12-25 MED ORDER — IBUPROFEN 800 MG PO TABS: 800.0000 mg | ORAL_TABLET | Freq: Three times a day (TID) | ORAL | Status: AC

## 2015-12-25 NOTE — Discharge Instructions (Signed)

## 2015-12-25 NOTE — ED Provider Notes (Signed)
CSN: [id number]     Arrival date & time 12/25/15  1150 History   First MD Initiated Contact with Patient 12/25/15 1201     Chief Complaint  Patient presents with  . Hip Pain     (Consider location/radiation/quality/duration/timing/severity/associated sxs/prior Treatment) HPI Comments: Presents with complaint of progressively worsening pain in left buttock, worse with palpation/pressure and standing. 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 This is a new job for her. She reports history of sciatica but that this is different than what she remembers. No back pain. She noticed aching in her left lower leg on occasion but denies swelling. No lower leg pain currently. No numbness or weakness.  The history is provided by the patient. No language interpreter was used.    Past Medical History  Diagnosis Date  . Hemorrhoids   . COPD (chronic obstructive pulmonary disease) (HCC)     no current inhaler use  . Shortness of breath     last 4 to 5 months  . Chest pain 11-28-2012  . GERD (gastroesophageal reflux disease)   . Depression     started on mitrazapine 11-30-2012   Past Surgical History  Procedure Laterality Date  . Tubal ligation  yrs ago  . Cryoablation of uterus  age 32  . Tonsillectomy  age 34    t and a  . Colonscopy  2010  . Transanal hemorrhoidal dearterialization N/A 12/02/2012    Procedure: TRANSANAL HEMORRHOIDAL DEARTERIALIZATION ligation pexy ;  Surgeon: Ardeth Sportsman, MD;  Location: [facility];  Service: General;  Laterality: N/A;  . Novasure ablation     Family History  Problem Relation Age of Onset  . Heart disease Sister    Social History  Substance Use Topics  . Smoking status: Current Every Day Smoker -- 0.50 packs/day for 0 years    Types: Cigarettes  . Smokeless tobacco: Never Used  . Alcohol Use: No   OB History    No data available     Review of Systems  Constitutional: Negative for fever  and chills.  Respiratory: Negative.  Negative for shortness of breath.   Cardiovascular: Negative.  Negative for chest pain.  Gastrointestinal: Negative.  Negative for abdominal pain.  Musculoskeletal:       See HPI.  Skin: Negative.  Negative for color change.  Neurological: Negative.  Negative for weakness and numbness.      Allergies  Naproxen; Penicillins; Trazodone and nefazodone; and Tramadol  Home Medications   Prior to Admission medications   Medication Sig Start Date End Date Taking? Authorizing Provider  Hydrocodone-Acetaminophen (VICODIN PO) Take by mouth.   Yes Historical Provider, MD   BP 156/97 mmHg  Pulse 85  Temp(Src) 98.2 F (36.8 C) (Oral)  Resp 16  Ht  (1.575 m)  Wt 48.988 kg  BMI 19.75 kg/m2  SpO2 100% Physical Exam  Constitutional: She is oriented to person, place, and time. She appears well-developed and well-nourished. No distress.  Cardiovascular: Intact distal pulses.   Abdominal: There is no tenderness.  Musculoskeletal:       Legs: No midline or paralumbar tenderness or swelling. There is point tenderness of left buttock without mass or swelling. Calf and thigh nontender without swelling or redness. FROM LE with full strength.   Neurological: She is alert and oriented to person, place, and time. Coordination normal.  Skin: Skin is warm and dry.  Psychiatric: She has a normal  mood and affect.    ED Course  Procedures (including critical care time) Labs Review Labs Reviewed - No data to display  Imaging Review No results found. I have personally reviewed and evaluated these images and lab results as part of my medical decision-making.   EKG Interpretation None      MDM   Final diagnoses:  None    1. Left buttock pain  No neurologic deficits of left lower extremity. No evidence of DVT of LE as there is no swelling, redness or tenderness. Pulses present distally. She has focal tenderness of left lateral buttock - consider  sciatica vs pyriformis syndrome requiring symptomatic treatment.     Elpidio AnisShari Luciano Cinquemani, PA-C 12/25/15 1252  Azalia BilisKevin Campos, MD 12/25/15 1257

## 2015-12-25 NOTE — ED Notes (Signed)
Left hip/buttock pain radiates down left leg-denies injury-states new job where she stands-NAD-steady gait
